# Patient Record
Sex: Female | Born: 1955 | Race: Black or African American | Hispanic: No | Marital: Married | Smoking: Former smoker
Health system: Southern US, Community
[De-identification: ages and names within clinical notes are randomized; demographics above are authoritative.]

## PROBLEM LIST (undated history)

## (undated) DIAGNOSIS — E119 Type 2 diabetes mellitus without complications: Secondary | ICD-10-CM

## (undated) DIAGNOSIS — Z973 Presence of spectacles and contact lenses: Secondary | ICD-10-CM

## (undated) DIAGNOSIS — T8859XA Other complications of anesthesia, initial encounter: Secondary | ICD-10-CM

## (undated) DIAGNOSIS — F329 Major depressive disorder, single episode, unspecified: Secondary | ICD-10-CM

## (undated) DIAGNOSIS — R011 Cardiac murmur, unspecified: Secondary | ICD-10-CM

## (undated) DIAGNOSIS — M1990 Unspecified osteoarthritis, unspecified site: Secondary | ICD-10-CM

## (undated) DIAGNOSIS — E785 Hyperlipidemia, unspecified: Secondary | ICD-10-CM

## (undated) DIAGNOSIS — I10 Essential (primary) hypertension: Secondary | ICD-10-CM

## (undated) DIAGNOSIS — H04123 Dry eye syndrome of bilateral lacrimal glands: Secondary | ICD-10-CM

## (undated) DIAGNOSIS — M25471 Effusion, right ankle: Secondary | ICD-10-CM

## (undated) DIAGNOSIS — K219 Gastro-esophageal reflux disease without esophagitis: Secondary | ICD-10-CM

## (undated) DIAGNOSIS — R32 Unspecified urinary incontinence: Secondary | ICD-10-CM

## (undated) DIAGNOSIS — M25472 Effusion, left ankle: Secondary | ICD-10-CM

## (undated) DIAGNOSIS — F32A Depression, unspecified: Secondary | ICD-10-CM

## (undated) DIAGNOSIS — T4145XA Adverse effect of unspecified anesthetic, initial encounter: Secondary | ICD-10-CM

## (undated) DIAGNOSIS — F419 Anxiety disorder, unspecified: Secondary | ICD-10-CM

## (undated) DIAGNOSIS — Z972 Presence of dental prosthetic device (complete) (partial): Secondary | ICD-10-CM

## (undated) HISTORY — PX: JOINT REPLACEMENT: SHX530

## (undated) HISTORY — PX: TUBAL LIGATION: SHX77

---

## 1998-01-10 ENCOUNTER — Other Ambulatory Visit: Admission: RE | Admit: 1998-01-10 | Discharge: 1998-01-10 | Payer: Self-pay | Admitting: Obstetrics and Gynecology

## 1999-01-23 ENCOUNTER — Other Ambulatory Visit: Admission: RE | Admit: 1999-01-23 | Discharge: 1999-01-23 | Payer: Self-pay | Admitting: Obstetrics and Gynecology

## 1999-06-09 ENCOUNTER — Encounter: Admission: RE | Admit: 1999-06-09 | Discharge: 1999-06-09 | Payer: Self-pay | Admitting: Obstetrics and Gynecology

## 1999-06-09 ENCOUNTER — Encounter: Payer: Self-pay | Admitting: Obstetrics and Gynecology

## 2000-01-17 ENCOUNTER — Other Ambulatory Visit: Admission: RE | Admit: 2000-01-17 | Discharge: 2000-01-17 | Payer: Self-pay | Admitting: Obstetrics and Gynecology

## 2001-01-01 HISTORY — PX: ABDOMINAL HYSTERECTOMY: SHX81

## 2001-02-04 ENCOUNTER — Other Ambulatory Visit: Admission: RE | Admit: 2001-02-04 | Discharge: 2001-02-04 | Payer: Self-pay | Admitting: Obstetrics and Gynecology

## 2001-06-04 ENCOUNTER — Inpatient Hospital Stay (HOSPITAL_COMMUNITY): Admission: RE | Admit: 2001-06-04 | Discharge: 2001-06-06 | Payer: Self-pay | Admitting: Obstetrics and Gynecology

## 2002-02-18 ENCOUNTER — Other Ambulatory Visit: Admission: RE | Admit: 2002-02-18 | Discharge: 2002-02-18 | Payer: Self-pay | Admitting: Obstetrics and Gynecology

## 2003-02-19 ENCOUNTER — Other Ambulatory Visit: Admission: RE | Admit: 2003-02-19 | Discharge: 2003-02-19 | Payer: Self-pay | Admitting: Obstetrics and Gynecology

## 2004-03-08 ENCOUNTER — Other Ambulatory Visit: Admission: RE | Admit: 2004-03-08 | Discharge: 2004-03-08 | Payer: Self-pay | Admitting: Obstetrics and Gynecology

## 2004-07-12 ENCOUNTER — Ambulatory Visit: Payer: Self-pay | Admitting: Internal Medicine

## 2005-01-29 ENCOUNTER — Ambulatory Visit: Payer: Self-pay | Admitting: Internal Medicine

## 2005-04-02 ENCOUNTER — Other Ambulatory Visit: Admission: RE | Admit: 2005-04-02 | Discharge: 2005-04-02 | Payer: Self-pay | Admitting: Obstetrics and Gynecology

## 2010-09-15 ENCOUNTER — Other Ambulatory Visit: Payer: Self-pay | Admitting: Family Medicine

## 2010-09-15 DIAGNOSIS — R51 Headache: Secondary | ICD-10-CM

## 2010-09-17 ENCOUNTER — Ambulatory Visit
Admission: RE | Admit: 2010-09-17 | Discharge: 2010-09-17 | Disposition: A | Discharge status: Home / Self Care | Payer: BC Managed Care – PPO | Source: Ambulatory Visit | Attending: Family Medicine | Admitting: Family Medicine

## 2010-09-17 DIAGNOSIS — R51 Headache: Secondary | ICD-10-CM

## 2012-12-12 ENCOUNTER — Other Ambulatory Visit: Payer: Self-pay | Admitting: Sports Medicine

## 2012-12-12 DIAGNOSIS — M5430 Sciatica, unspecified side: Secondary | ICD-10-CM

## 2012-12-12 DIAGNOSIS — M545 Low back pain: Secondary | ICD-10-CM

## 2013-01-15 ENCOUNTER — Ambulatory Visit
Admission: RE | Admit: 2013-01-15 | Discharge: 2013-01-15 | Disposition: A | Discharge status: Home / Self Care | Payer: BC Managed Care – PPO | Source: Ambulatory Visit | Attending: Sports Medicine | Admitting: Sports Medicine

## 2013-01-15 DIAGNOSIS — M5430 Sciatica, unspecified side: Secondary | ICD-10-CM

## 2013-01-15 DIAGNOSIS — M545 Low back pain, unspecified: Secondary | ICD-10-CM

## 2014-02-04 ENCOUNTER — Other Ambulatory Visit: Payer: Self-pay | Admitting: Obstetrics and Gynecology

## 2014-02-05 LAB — CYTOLOGY - PAP

## 2015-01-11 ENCOUNTER — Ambulatory Visit: Payer: Self-pay | Admitting: Orthopedic Surgery

## 2015-01-11 NOTE — Progress Notes (Signed)
Preoperative surgical orders have been place into the Epic hospital system for Debra Woods on 01/11/2015, 9:22 PM  by Patrica DuelPERKINS, ALEXZANDREW for surgery on 01/28/2015.  Preop Total Hip - Anterior Approach orders including IV Tylenol, and IV Decadron as long as there are no contraindications to the above medications. Avel Peacerew Perkins, PA-C

## 2015-01-17 ENCOUNTER — Encounter (HOSPITAL_COMMUNITY)
Admission: RE | Admit: 2015-01-17 | Discharge: 2015-01-17 | Disposition: A | Discharge status: Home / Self Care | Payer: BC Managed Care – PPO | Source: Ambulatory Visit | Attending: Orthopedic Surgery | Admitting: Orthopedic Surgery

## 2015-01-17 ENCOUNTER — Encounter (HOSPITAL_COMMUNITY): Payer: Self-pay

## 2015-01-17 DIAGNOSIS — R9431 Abnormal electrocardiogram [ECG] [EKG]: Secondary | ICD-10-CM | POA: Insufficient documentation

## 2015-01-17 DIAGNOSIS — Z01812 Encounter for preprocedural laboratory examination: Secondary | ICD-10-CM | POA: Insufficient documentation

## 2015-01-17 DIAGNOSIS — Z79899 Other long term (current) drug therapy: Secondary | ICD-10-CM | POA: Insufficient documentation

## 2015-01-17 DIAGNOSIS — Z0183 Encounter for blood typing: Secondary | ICD-10-CM | POA: Insufficient documentation

## 2015-01-17 DIAGNOSIS — E119 Type 2 diabetes mellitus without complications: Secondary | ICD-10-CM | POA: Diagnosis not present

## 2015-01-17 DIAGNOSIS — Z01818 Encounter for other preprocedural examination: Secondary | ICD-10-CM | POA: Diagnosis present

## 2015-01-17 DIAGNOSIS — M1612 Unilateral primary osteoarthritis, left hip: Secondary | ICD-10-CM | POA: Insufficient documentation

## 2015-01-17 DIAGNOSIS — F329 Major depressive disorder, single episode, unspecified: Secondary | ICD-10-CM | POA: Insufficient documentation

## 2015-01-17 DIAGNOSIS — Z7984 Long term (current) use of oral hypoglycemic drugs: Secondary | ICD-10-CM | POA: Insufficient documentation

## 2015-01-17 DIAGNOSIS — I10 Essential (primary) hypertension: Secondary | ICD-10-CM | POA: Diagnosis not present

## 2015-01-17 DIAGNOSIS — F419 Anxiety disorder, unspecified: Secondary | ICD-10-CM | POA: Insufficient documentation

## 2015-01-17 DIAGNOSIS — K219 Gastro-esophageal reflux disease without esophagitis: Secondary | ICD-10-CM | POA: Diagnosis not present

## 2015-01-17 HISTORY — DX: Anxiety disorder, unspecified: F41.9

## 2015-01-17 HISTORY — DX: Other complications of anesthesia, initial encounter: T88.59XA

## 2015-01-17 HISTORY — DX: Gastro-esophageal reflux disease without esophagitis: K21.9

## 2015-01-17 HISTORY — DX: Major depressive disorder, single episode, unspecified: F32.9

## 2015-01-17 HISTORY — DX: Adverse effect of unspecified anesthetic, initial encounter: T41.45XA

## 2015-01-17 HISTORY — DX: Depression, unspecified: F32.A

## 2015-01-17 HISTORY — DX: Unspecified osteoarthritis, unspecified site: M19.90

## 2015-01-17 HISTORY — DX: Essential (primary) hypertension: I10

## 2015-01-17 LAB — URINE MICROSCOPIC-ADD ON: RBC / HPF: NONE SEEN RBC/hpf (ref 0–5)

## 2015-01-17 LAB — CBC
HEMATOCRIT: 37.7 % (ref 36.0–46.0)
Hemoglobin: 12.1 g/dL (ref 12.0–15.0)
MCH: 27.4 pg (ref 26.0–34.0)
MCHC: 32.1 g/dL (ref 30.0–36.0)
MCV: 85.3 fL (ref 78.0–100.0)
Platelets: 359 10*3/uL (ref 150–400)
RBC: 4.42 MIL/uL (ref 3.87–5.11)
RDW: 13.4 % (ref 11.5–15.5)
WBC: 9.5 10*3/uL (ref 4.0–10.5)

## 2015-01-17 LAB — URINALYSIS, ROUTINE W REFLEX MICROSCOPIC
BILIRUBIN URINE: NEGATIVE
GLUCOSE, UA: NEGATIVE mg/dL
HGB URINE DIPSTICK: NEGATIVE
Ketones, ur: 15 mg/dL — AB
Nitrite: NEGATIVE
PH: 5.5 (ref 5.0–8.0)
Protein, ur: NEGATIVE mg/dL
SPECIFIC GRAVITY, URINE: 1.023 (ref 1.005–1.030)

## 2015-01-17 LAB — COMPREHENSIVE METABOLIC PANEL
ALBUMIN: 4 g/dL (ref 3.5–5.0)
ALK PHOS: 97 U/L (ref 38–126)
ALT: 16 U/L (ref 14–54)
ANION GAP: 10 (ref 5–15)
AST: 18 U/L (ref 15–41)
BILIRUBIN TOTAL: 0.7 mg/dL (ref 0.3–1.2)
BUN: 18 mg/dL (ref 6–20)
CALCIUM: 9.7 mg/dL (ref 8.9–10.3)
CO2: 27 mmol/L (ref 22–32)
Chloride: 103 mmol/L (ref 101–111)
Creatinine, Ser: 1.27 mg/dL — ABNORMAL HIGH (ref 0.44–1.00)
GFR, EST AFRICAN AMERICAN: 53 mL/min — AB (ref >60)
GFR, EST NON AFRICAN AMERICAN: 46 mL/min — AB (ref >60)
GLUCOSE: 141 mg/dL — AB (ref 65–99)
POTASSIUM: 4.2 mmol/L (ref 3.5–5.1)
Sodium: 140 mmol/L (ref 135–145)
TOTAL PROTEIN: 6.6 g/dL (ref 6.5–8.1)

## 2015-01-17 LAB — TYPE AND SCREEN
ABO/RH(D): O POS
ANTIBODY SCREEN: NEGATIVE

## 2015-01-17 LAB — PROTIME-INR
INR: 1.01 (ref 0.00–1.49)
PROTHROMBIN TIME: 13.5 s (ref 11.6–15.2)

## 2015-01-17 LAB — APTT: APTT: 25 s (ref 24–37)

## 2015-01-17 LAB — SURGICAL PCR SCREEN
MRSA, PCR: NEGATIVE
Staphylococcus aureus: NEGATIVE

## 2015-01-17 LAB — GLUCOSE, CAPILLARY: Glucose-Capillary: 153 mg/dL — ABNORMAL HIGH (ref 65–99)

## 2015-01-17 LAB — ABO/RH: ABO/RH(D): O POS

## 2015-01-17 NOTE — Progress Notes (Signed)
Anesthesia PAT Evaluation: Patient is a 60 year old female scheduled for left THA, anterior approach on 01/28/15 by Dr. Lequita HaltAluisio.  History includes smoking, DM2, GERD, HTN, anxiety, depression, arthritis, hysterectomy. BMI 29.64. For anesthesia history, reported CPR/defibrillation after her hysterectomy ~ 2003 (at [facility]) due to her "quit breathing." She denied cardiac issues and having to see a cardiologist or get additional testing. She denied OSA history and did not have to go home on O2 but took about 1 month to recover at home (was "sluggish"). She denied family history of pseudocholinesterase deficiency or known family history of anesthesia complication. She did okay with colonoscopy and recent EGD with Dr. Madilyn FiremanHayes. She has not required GA since her hysterectomy.   06/04/01 Hospital records received this afternoon. According to Code note: "...patient was found by nurse unresponsive to sternal rub. Patient had been on Morphine PCA, and had received Toradol ~ 45 minutes earlier. Patient apneic on arrival, but after Narcan - spontaneous respirations with 100% O2 sat, RR 20, peripheral pulses intact, bonding. Rhythm strip: Sinus rhythm, rate 91, BP 114/60. Patient then appropriate in responses, although slightly groggy; [facility]."    PCP is Dr. Merri Brunetteandace Smith who has medically cleared patient for surgery.   Meds include Lipitor, Wellbutrin XL, Votaren, lisinopril-HCTZ, metformin, Protonix, Ambien. Recently had also started Januvia samples but is now out and will have to follow-up with her PCP.  01/17/15 EKG: NSR, cannot rule out anterior infarct (age undetermined). No comparison tracing in Epic or Muse. She denied CP, SOB. Has chronic BLE edema. Activity is limited by hip pain, but she still does weight training primarily with upper body. On exam, heart RRR. No murmur noted. Lungs clear. No carotid bruits noted. Short neck. Somewhat small mouth  (documented as easy intubation with MAC 3 on 06/04/01 anesthesia record).  Preoperative labs noted. A1c pending. Reports fasting home CBG ~ 140-145.   She is still determining if she would like spinal versus GA. Denied history of CAD or murmur, bleeding history, anticoagulants, and back surgeries. We discussed some risks/benefits associated with each. She feels comfortable talking with her assigned anesthesiologist on the day of surgery, but wanted to make sure they were aware of her "code" following hysterectomy in 2003. I have updated anesthesiologist Dr. Krista BlueSinger.  Velna Ochsllison Estell Puccini, PA-C [facility]Anesthesiology Phone [phone] 01/17/2015 5:40 PM

## 2015-01-17 NOTE — Progress Notes (Signed)
Pt requested to see Debra Woods  To discuss complications of anesthesia.

## 2015-01-17 NOTE — Pre-Procedure Instructions (Signed)
    Eloy EndBrenda B Ziemann  01/17/2015      [id number] Renato Battles- [place], [place] - [address] [place] [id number] Phone: [phone] Fax: [phone]    Your procedure is scheduled on 01/28/15.  [facility] Admitting at 12 noon  Call this number if you have problems the morning of surgery:  [phone]   Remember:  Do not eat food or drink liquids after midnight.  Take these medicines the morning of surgery with A SIP OF WATER --protonix   Do not wear jewelry, make-up or nail polish.  Do not wear lotions, powders, or perfumes.  You may wear deodorant.  Do not shave 48 hours prior to surgery.  Men may shave face and neck.  Do not bring valuables to the hospital.  [facility] is not responsible for any belongings or valuables.  Contacts, dentures or bridgework may not be worn into surgery.  Leave your suitcase in the car.  After surgery it may be brought to your room.  For patients admitted to the hospital, discharge time will be determined by your treatment team.  Patients discharged the day of surgery will not be allowed to drive home.   Name and phone number of your driver:   Special instructions:   Please read over the following fact sheets that you were given. Pain Booklet, Coughing and Deep Breathing, Blood Transfusion Information, MRSA Information and Surgical Site Infection Prevention

## 2015-01-18 LAB — HEMOGLOBIN A1C
Hgb A1c MFr Bld: 7.8 % — ABNORMAL HIGH (ref 4.8–5.6)
Mean Plasma Glucose: 177 mg/dL

## 2015-01-27 ENCOUNTER — Ambulatory Visit: Payer: Self-pay | Admitting: Orthopedic Surgery

## 2015-01-27 MED ORDER — SODIUM CHLORIDE 0.9 % IV SOLN: INTRAVENOUS | Status: DC

## 2015-01-27 MED ORDER — CHLORHEXIDINE GLUCONATE 4 % EX LIQD: 60.0000 mL | Freq: Once | CUTANEOUS | Status: DC

## 2015-01-27 MED ORDER — CEFAZOLIN SODIUM-DEXTROSE 2-3 GM-% IV SOLR
2.0000 g | INTRAVENOUS | Status: AC
  Administered 2015-01-28: 2 g via INTRAVENOUS
  Filled 2015-01-27: qty 50

## 2015-01-27 MED ORDER — ACETAMINOPHEN 10 MG/ML IV SOLN
1000.0000 mg | Freq: Once | INTRAVENOUS | Status: AC
  Administered 2015-01-28: 1000 mg via INTRAVENOUS
  Filled 2015-01-27: qty 100

## 2015-01-27 MED ORDER — TRANEXAMIC ACID 1000 MG/10ML IV SOLN
1000.0000 mg | INTRAVENOUS | Status: AC
  Administered 2015-01-28: 1000 mg via INTRAVENOUS
  Filled 2015-01-27: qty 10

## 2015-01-27 MED ORDER — DEXAMETHASONE SODIUM PHOSPHATE 10 MG/ML IJ SOLN
10.0000 mg | Freq: Once | INTRAMUSCULAR | Status: AC
  Administered 2015-01-28: 10 mg via INTRAVENOUS
  Filled 2015-01-27: qty 1

## 2015-01-27 NOTE — H&P (Signed)
Debra Woods DOB: 09/24/1955 Married / Language: English / Race: Black or African American Female Date of Admission:  01/28/2015 CC:  Left Hip Pain History of Present Illness The patient is a 59 year old female who comes in today for a preoperative History and Physical. The patient is scheduled for a left total hip arthroplasty (anterior) to be performed by Dr. Frank V. [name], MD at [facility] on 01-28-2015. The patient reported left hip problems including pain (pt. can't walk well), giving way, catching (locking) and popping symptoms that have been present for 1 year(s). The symptoms began without any known injury. The patient reports symptoms radiating to the: right thigh (lateral thigh). The patient describes the hip problem as sharp, dull, aching, throbbing, grinding, stabbing and burning.The symptoms are described as moderate in severity.The patient feels as if their symptoms are does feel they are worsening. Current treatment includes use of a walker ((cane)) and nonsteroidal anti-inflammatory drugs (hydrocodone for pain.). Previous workup for this problem has included hip x-rays. Previous treatment for this problem has included nonsteroidal anti-inflammatory drugs (also, cortisone injection in her left hip.-injection helped some). Note for "Hip problem": diagnosed with OA in left hip. She has had severe problems with that hip for over a year now. She has some discomfort prior to that, but it has gotten much worse in the past year. Left hip hurts more than the right. It is limiting what she can and cannot do. She tries to do most of her activities and push through the pain and is able to do so on most occasions, but on other times, it is very difficult to push through. She has had two intraarticular injections, the first of which helped for several months, second of which helped for short amount of time. Pain is in the left groin radiating towards the knee. Right side also hurts, but to a  lesser degree. She has had a significant loss of motion and function which has gotten progressively worse in the past year. She has also loss of movement on the right side. She is at a stage where her hips essentially taking over her life and she is ready to proceed with surgery at this time. They have been treated conservatively in the past for the above stated problem and despite conservative measures, they continue to have progressive pain and severe functional limitations and dysfunction. They have failed non-operative management including home exercise, medications, and injections. It is felt that they would benefit from undergoing total joint replacement. Risks and benefits of the procedure have been discussed with the patient and they elect to proceed with surgery. There are no active contraindications to surgery such as ongoing infection or rapidly progressive neurological disease.  Problem List/Past Medical  Primary osteoarthritis of left hip (M16.12)  Impaired Vision  Depression  High blood pressure  Hypercholesterolemia  Gastroesophageal Reflux Disease  Diabetes Mellitus, Type II  Menopause  Allergies No Known Drug Allergies  Family History Cerebrovascular Accident  father Diabetes Mellitus  mother, father and sister Heart Disease  father Heart disease in female family member before age 55  Hypertension  mother, father and sister Rheumatoid Arthritis  mother  Social History Alcohol use  current drinker; drinks wine; 5-7 per week Children  2 Current work status  working full time Drug/Alcohol Rehab (Currently)  no Drug/Alcohol Rehab (Previously)  no Illicit drug use  no Living situation  live with spouse Marital status  married Number of flights of stairs before winded    2-3 Pain Contract  no Tobacco / smoke exposure  yes outdoors only Tobacco use  current some days smoker; smoke(d) less than 1/2 pack(s) per day Post-Surgical Plans   Home  Medication History MetFORMIN HCl (850MG Tablet, Oral two times daily) Active. Diclofenac Sodium (75MG Tablet DR, Oral daily) Active. BuPROPion HCl ER (XL) (300MG Tablet ER 24HR, Oral daily) Active. (four days a week) Pantoprazole Sodium (40MG Tablet DR, Oral daily) Active. Atorvastatin Calcium (20MG Tablet, Oral daily) Active. Lisinopril-Hydrochlorothiazide (10-12.5MG Tablet, Oral daily) Active. Hydrocodone-Acetaminophen (5-325MG Tablet, Oral) Active. Zolpidem Tartrate (10MG Tablet, 1/2 tab Oral) Active. FreeStyle Lite Test (In Vitro) Active.  Past Surgical History Hysterectomy  partial (non-cancerous) Tubal Ligation   Review of Systems General Not Present- Chills, Fatigue, Fever, Memory Loss, Night Sweats, Weight Gain and Weight Loss. Skin Not Present- Eczema, Hives, Itching, Lesions and Rash. HEENT Not Present- Dentures, Double Vision, Headache, Hearing Loss, Tinnitus and Visual Loss. Respiratory Not Present- Allergies, Chronic Cough, Coughing up blood, Shortness of breath at rest and Shortness of breath with exertion. Cardiovascular Not Present- Chest Pain, Difficulty Breathing Lying Down, Murmur, Palpitations, Racing/skipping heartbeats and Swelling. Gastrointestinal Not Present- Abdominal Pain, Bloody Stool, Constipation, Diarrhea, Difficulty Swallowing, Heartburn, Jaundice, Loss of appetitie, Nausea and Vomiting. Female Genitourinary Present- Urinating at Night. Not Present- Blood in Urine, Discharge, Flank Pain, Incontinence, Painful Urination, Urgency, Urinary frequency, Urinary Retention and Weak urinary stream. Musculoskeletal Not Present- Back Pain, Joint Pain, Joint Swelling, Morning Stiffness, Muscle Pain, Muscle Weakness and Spasms. Neurological Not Present- Blackout spells, Difficulty with balance, Dizziness, Paralysis, Tremor and Weakness. Psychiatric Not Present- Insomnia.  Vitals Weight: 165 lb Height: 62in Weight was reported by patient. Height  was reported by patient. Body Surface Area: 1.76 m Body Mass Index: 30.18 kg/m  BP: 122/76 (Sitting, Left Arm, Standard)  Physical Exam General Mental Status -Alert, cooperative and good historian. General Appearance-pleasant, Not in acute distress. Orientation-Oriented X3. Build & Nutrition-Well nourished and Well developed.  Head and Neck Head-normocephalic, atraumatic . Neck Global Assessment - supple, no bruit auscultated on the right, no bruit auscultated on the left.  Eye Pupil - Bilateral-Regular and Round. Motion - Bilateral-EOMI.  ENMT Note: upper partial denture plate   Chest and Lung Exam Auscultation Breath sounds - clear at anterior chest wall and clear at posterior chest wall. Adventitious sounds - No Adventitious sounds.  Cardiovascular Auscultation Rhythm - Regular rate and rhythm. Heart Sounds - S1 WNL and S2 WNL. Murmurs & Other Heart Sounds - Auscultation of the heart reveals - No Murmurs.  Abdomen Palpation/Percussion Tenderness - Abdomen is non-tender to palpation. Rigidity (guarding) - Abdomen is soft. Auscultation Auscultation of the abdomen reveals - Bowel sounds normal.  Female Genitourinary Note: Not done, not pertinent to present illness   Musculoskeletal Note: Very pleasant well-developed female, alert and oriented, in no apparent distress. Evaluation of the left hip shows flexion to about 70, no internal rotation and in fact she has got about 20 degree external rotation contracture. She can externally rotate beyond that. She can only abduct about 20. Right hip flexion about 90, minimal internal rotation, about 30 external rotation and 30 abduction. Knee exams are normal. Pulse, sensation and motor intact both lower extremities.  RADIOGRAPHS AP pelvis and lateral of the hips show severe end-stage arthritis, left worse than right hip. She has got complete bone on bone on the left with large subchondral cyst. This is a  marked progression from a year and half ago. Her right hip is just about bone   on bone also with very Orne joint space left.  Assessment & Plan Primary osteoarthritis of left hip (M16.12)  Note:Surgical Plans: Left Total Hip Replacement - Anterior Approach  Disposition: Home  PCP: Dr. C. Smith - Patient has been seen preoperatively and felt to be stable for surgery.  IV TXA  Anesthesia Issues: Cardiac Arrest following the Hysterectomy (approx. 14 years ago).  Signed electronically by Alexzandrew L Perkins, III PA-C 

## 2015-01-28 ENCOUNTER — Inpatient Hospital Stay (HOSPITAL_COMMUNITY): Payer: BC Managed Care – PPO | Admitting: Certified Registered Nurse Anesthetist

## 2015-01-28 ENCOUNTER — Inpatient Hospital Stay (HOSPITAL_COMMUNITY)
Admission: RE | Admit: 2015-01-28 | Discharge: 2015-01-30 | DRG: 470 | Disposition: A | Discharge status: Home Health | Payer: BC Managed Care – PPO | Source: Ambulatory Visit | Attending: Orthopedic Surgery | Admitting: Orthopedic Surgery

## 2015-01-28 ENCOUNTER — Inpatient Hospital Stay (HOSPITAL_COMMUNITY): Payer: BC Managed Care – PPO

## 2015-01-28 ENCOUNTER — Inpatient Hospital Stay (HOSPITAL_COMMUNITY): Payer: BC Managed Care – PPO | Admitting: Vascular Surgery

## 2015-01-28 ENCOUNTER — Encounter (HOSPITAL_COMMUNITY): Payer: Self-pay | Admitting: Surgery

## 2015-01-28 ENCOUNTER — Encounter (HOSPITAL_COMMUNITY)
Admission: RE | Disposition: A | Discharge status: Home Health | Payer: Self-pay | Source: Ambulatory Visit | Attending: Orthopedic Surgery

## 2015-01-28 DIAGNOSIS — I10 Essential (primary) hypertension: Secondary | ICD-10-CM | POA: Diagnosis present

## 2015-01-28 DIAGNOSIS — M1612 Unilateral primary osteoarthritis, left hip: Principal | ICD-10-CM | POA: Diagnosis present

## 2015-01-28 DIAGNOSIS — Z7984 Long term (current) use of oral hypoglycemic drugs: Secondary | ICD-10-CM

## 2015-01-28 DIAGNOSIS — Z96649 Presence of unspecified artificial hip joint: Secondary | ICD-10-CM

## 2015-01-28 DIAGNOSIS — F419 Anxiety disorder, unspecified: Secondary | ICD-10-CM | POA: Diagnosis present

## 2015-01-28 DIAGNOSIS — Z419 Encounter for procedure for purposes other than remedying health state, unspecified: Secondary | ICD-10-CM

## 2015-01-28 DIAGNOSIS — K219 Gastro-esophageal reflux disease without esophagitis: Secondary | ICD-10-CM | POA: Diagnosis present

## 2015-01-28 DIAGNOSIS — F329 Major depressive disorder, single episode, unspecified: Secondary | ICD-10-CM | POA: Diagnosis present

## 2015-01-28 DIAGNOSIS — Z79899 Other long term (current) drug therapy: Secondary | ICD-10-CM | POA: Diagnosis not present

## 2015-01-28 DIAGNOSIS — H547 Unspecified visual loss: Secondary | ICD-10-CM | POA: Diagnosis present

## 2015-01-28 DIAGNOSIS — E7800 Pure hypercholesterolemia, unspecified: Secondary | ICD-10-CM | POA: Diagnosis present

## 2015-01-28 DIAGNOSIS — M169 Osteoarthritis of hip, unspecified: Secondary | ICD-10-CM | POA: Diagnosis present

## 2015-01-28 DIAGNOSIS — E119 Type 2 diabetes mellitus without complications: Secondary | ICD-10-CM | POA: Diagnosis present

## 2015-01-28 DIAGNOSIS — M25552 Pain in left hip: Secondary | ICD-10-CM | POA: Diagnosis present

## 2015-01-28 DIAGNOSIS — F17200 Nicotine dependence, unspecified, uncomplicated: Secondary | ICD-10-CM | POA: Diagnosis present

## 2015-01-28 HISTORY — DX: Hyperlipidemia, unspecified: E78.5

## 2015-01-28 HISTORY — DX: Type 2 diabetes mellitus without complications: E11.9

## 2015-01-28 HISTORY — PX: TOTAL HIP ARTHROPLASTY: SHX124

## 2015-01-28 LAB — GLUCOSE, CAPILLARY
Glucose-Capillary: 144 mg/dL — ABNORMAL HIGH (ref 65–99)
Glucose-Capillary: 126 mg/dL — ABNORMAL HIGH (ref 65–99)
Glucose-Capillary: 184 mg/dL — ABNORMAL HIGH (ref 65–99)

## 2015-01-28 SURGERY — ARTHROPLASTY, HIP, TOTAL, ANTERIOR APPROACH
Anesthesia: Monitor Anesthesia Care | Site: Hip | Laterality: Left

## 2015-01-28 MED ORDER — MIDAZOLAM HCL 5 MG/5ML IJ SOLN
INTRAMUSCULAR | Status: DC | PRN
  Administered 2015-01-28: 2 mg via INTRAVENOUS

## 2015-01-28 MED ORDER — RIVAROXABAN 10 MG PO TABS
10.0000 mg | ORAL_TABLET | Freq: Every day | ORAL | Status: DC
Start: 2015-01-29 — End: 2015-01-30
  Administered 2015-01-29 – 2015-01-30 (×2): 10 mg via ORAL
  Filled 2015-01-28 (×2): qty 1

## 2015-01-28 MED ORDER — ZOLPIDEM TARTRATE 5 MG PO TABS: 5.0000 mg | ORAL_TABLET | Freq: Every evening | ORAL | Status: DC | PRN

## 2015-01-28 MED ORDER — MORPHINE SULFATE (PF) 2 MG/ML IV SOLN
1.0000 mg | INTRAVENOUS | Status: DC | PRN
  Administered 2015-01-28: 1 mg via INTRAVENOUS
  Filled 2015-01-28: qty 1

## 2015-01-28 MED ORDER — METFORMIN HCL 850 MG PO TABS
850.0000 mg | ORAL_TABLET | Freq: Two times a day (BID) | ORAL | Status: DC
  Administered 2015-01-29 – 2015-01-30 (×3): 850 mg via ORAL
  Filled 2015-01-28 (×6): qty 1

## 2015-01-28 MED ORDER — PHENOL 1.4 % MT LIQD
1.0000 | OROMUCOSAL | Status: DC | PRN
Start: 2015-01-28 — End: 2015-01-30

## 2015-01-28 MED ORDER — OXYCODONE HCL 5 MG PO TABS: 5.0000 mg | ORAL_TABLET | Freq: Once | ORAL | Status: DC | PRN

## 2015-01-28 MED ORDER — OXYCODONE HCL 5 MG PO TABS
5.0000 mg | ORAL_TABLET | ORAL | Status: DC | PRN
  Administered 2015-01-28 – 2015-01-30 (×5): 10 mg via ORAL
  Filled 2015-01-28 (×7): qty 2

## 2015-01-28 MED ORDER — DOCUSATE SODIUM 100 MG PO CAPS
100.0000 mg | ORAL_CAPSULE | Freq: Two times a day (BID) | ORAL | Status: DC
  Administered 2015-01-28 – 2015-01-30 (×4): 100 mg via ORAL
  Filled 2015-01-28 (×4): qty 1

## 2015-01-28 MED ORDER — 0.9 % SODIUM CHLORIDE (POUR BTL) OPTIME
TOPICAL | Status: DC | PRN
Start: 2015-01-28 — End: 2015-01-28
  Administered 2015-01-28: 1000 mL

## 2015-01-28 MED ORDER — BISACODYL 10 MG RE SUPP: 10.0000 mg | Freq: Every day | RECTAL | Status: DC | PRN

## 2015-01-28 MED ORDER — KETOROLAC TROMETHAMINE 15 MG/ML IJ SOLN
INTRAMUSCULAR | Status: AC
  Filled 2015-01-28: qty 1

## 2015-01-28 MED ORDER — PHENYLEPHRINE HCL 10 MG/ML IJ SOLN
INTRAMUSCULAR | Status: DC | PRN
  Administered 2015-01-28 (×4): 120 ug via INTRAVENOUS

## 2015-01-28 MED ORDER — MENTHOL 3 MG MT LOZG: 1.0000 | LOZENGE | OROMUCOSAL | Status: DC | PRN

## 2015-01-28 MED ORDER — LACTATED RINGERS IV SOLN
INTRAVENOUS | Status: DC
  Administered 2015-01-28 (×2): via INTRAVENOUS

## 2015-01-28 MED ORDER — METOCLOPRAMIDE HCL 5 MG PO TABS: 5.0000 mg | ORAL_TABLET | Freq: Three times a day (TID) | ORAL | Status: DC | PRN

## 2015-01-28 MED ORDER — ACETAMINOPHEN 325 MG PO TABS
650.0000 mg | ORAL_TABLET | Freq: Four times a day (QID) | ORAL | Status: DC | PRN
  Filled 2015-01-28: qty 2

## 2015-01-28 MED ORDER — KETOROLAC TROMETHAMINE 15 MG/ML IJ SOLN
7.5000 mg | Freq: Four times a day (QID) | INTRAMUSCULAR | Status: AC | PRN
  Administered 2015-01-28: 7.5 mg via INTRAVENOUS
  Filled 2015-01-28: qty 1

## 2015-01-28 MED ORDER — METHOCARBAMOL 500 MG PO TABS
500.0000 mg | ORAL_TABLET | Freq: Four times a day (QID) | ORAL | Status: DC | PRN
  Filled 2015-01-28: qty 1

## 2015-01-28 MED ORDER — PHENYLEPHRINE 40 MCG/ML (10ML) SYRINGE FOR IV PUSH (FOR BLOOD PRESSURE SUPPORT)
PREFILLED_SYRINGE | INTRAVENOUS | Status: AC
  Filled 2015-01-28: qty 40

## 2015-01-28 MED ORDER — FENTANYL CITRATE (PF) 100 MCG/2ML IJ SOLN
INTRAMUSCULAR | Status: DC | PRN
  Administered 2015-01-28: 25 ug via INTRAVENOUS
  Administered 2015-01-28: 50 ug via INTRAVENOUS

## 2015-01-28 MED ORDER — BUPROPION HCL ER (XL) 150 MG PO TB24
300.0000 mg | ORAL_TABLET | Freq: Every morning | ORAL | Status: DC
  Administered 2015-01-29 – 2015-01-30 (×2): 300 mg via ORAL
  Filled 2015-01-28 (×2): qty 2

## 2015-01-28 MED ORDER — ACETAMINOPHEN 650 MG RE SUPP: 650.0000 mg | Freq: Four times a day (QID) | RECTAL | Status: DC | PRN

## 2015-01-28 MED ORDER — PHENYLEPHRINE HCL 10 MG/ML IJ SOLN
10.0000 mg | INTRAVENOUS | Status: DC | PRN
  Administered 2015-01-28: 15 ug/min via INTRAVENOUS

## 2015-01-28 MED ORDER — METOCLOPRAMIDE HCL 5 MG/ML IJ SOLN: 5.0000 mg | Freq: Three times a day (TID) | INTRAMUSCULAR | Status: DC | PRN

## 2015-01-28 MED ORDER — METHOCARBAMOL 1000 MG/10ML IJ SOLN
500.0000 mg | Freq: Four times a day (QID) | INTRAMUSCULAR | Status: DC | PRN
  Filled 2015-01-28: qty 5

## 2015-01-28 MED ORDER — ALBUMIN HUMAN 5 % IV SOLN
INTRAVENOUS | Status: DC | PRN
  Administered 2015-01-28: 13:00:00 via INTRAVENOUS

## 2015-01-28 MED ORDER — FENTANYL CITRATE (PF) 100 MCG/2ML IJ SOLN
25.0000 ug | INTRAMUSCULAR | Status: DC | PRN
  Administered 2015-01-28 (×2): 50 ug via INTRAVENOUS

## 2015-01-28 MED ORDER — PROPOFOL 500 MG/50ML IV EMUL
INTRAVENOUS | Status: DC | PRN
  Administered 2015-01-28: 75 ug/kg/min via INTRAVENOUS

## 2015-01-28 MED ORDER — PANTOPRAZOLE SODIUM 40 MG PO TBEC
40.0000 mg | DELAYED_RELEASE_TABLET | Freq: Every day | ORAL | Status: DC
  Administered 2015-01-29 – 2015-01-30 (×2): 40 mg via ORAL
  Filled 2015-01-28 (×2): qty 1

## 2015-01-28 MED ORDER — SODIUM CHLORIDE 0.9 % IV SOLN
INTRAVENOUS | Status: DC
  Administered 2015-01-28: 21:00:00 via INTRAVENOUS

## 2015-01-28 MED ORDER — ONDANSETRON HCL 4 MG/2ML IJ SOLN: 4.0000 mg | Freq: Four times a day (QID) | INTRAMUSCULAR | Status: DC | PRN

## 2015-01-28 MED ORDER — DEXAMETHASONE SODIUM PHOSPHATE 10 MG/ML IJ SOLN
10.0000 mg | Freq: Once | INTRAMUSCULAR | Status: AC
  Administered 2015-01-29: 10 mg via INTRAVENOUS
  Filled 2015-01-28: qty 1

## 2015-01-28 MED ORDER — ATORVASTATIN CALCIUM 20 MG PO TABS
20.0000 mg | ORAL_TABLET | Freq: Every day | ORAL | Status: DC
  Administered 2015-01-29 – 2015-01-30 (×2): 20 mg via ORAL
  Filled 2015-01-28 (×2): qty 1

## 2015-01-28 MED ORDER — BUPIVACAINE HCL (PF) 0.25 % IJ SOLN
INTRAMUSCULAR | Status: AC
Start: 2015-01-28 — End: 2015-01-28
  Filled 2015-01-28: qty 30

## 2015-01-28 MED ORDER — MIDAZOLAM HCL 2 MG/2ML IJ SOLN
INTRAMUSCULAR | Status: AC
  Filled 2015-01-28: qty 2

## 2015-01-28 MED ORDER — CEFAZOLIN SODIUM-DEXTROSE 2-3 GM-% IV SOLR
2.0000 g | Freq: Four times a day (QID) | INTRAVENOUS | Status: AC
  Administered 2015-01-28 – 2015-01-29 (×2): 2 g via INTRAVENOUS
  Filled 2015-01-28 (×2): qty 50

## 2015-01-28 MED ORDER — EPHEDRINE SULFATE 50 MG/ML IJ SOLN
INTRAMUSCULAR | Status: DC | PRN
  Administered 2015-01-28: 5 mg via INTRAVENOUS

## 2015-01-28 MED ORDER — OXYCODONE HCL 5 MG/5ML PO SOLN: 5.0000 mg | Freq: Once | ORAL | Status: DC | PRN

## 2015-01-28 MED ORDER — FENTANYL CITRATE (PF) 100 MCG/2ML IJ SOLN
INTRAMUSCULAR | Status: AC
  Filled 2015-01-28: qty 2

## 2015-01-28 MED ORDER — BUPIVACAINE HCL (PF) 0.25 % IJ SOLN
INTRAMUSCULAR | Status: DC | PRN
  Administered 2015-01-28: 30 mL

## 2015-01-28 MED ORDER — ACETAMINOPHEN 500 MG PO TABS
1000.0000 mg | ORAL_TABLET | Freq: Four times a day (QID) | ORAL | Status: AC
  Administered 2015-01-28 – 2015-01-29 (×4): 1000 mg via ORAL
  Filled 2015-01-28 (×4): qty 2

## 2015-01-28 MED ORDER — BUPIVACAINE IN DEXTROSE 0.75-8.25 % IT SOLN
INTRATHECAL | Status: DC | PRN
  Administered 2015-01-28: 1.6 mL via INTRATHECAL

## 2015-01-28 MED ORDER — FENTANYL CITRATE (PF) 250 MCG/5ML IJ SOLN
INTRAMUSCULAR | Status: AC
  Filled 2015-01-28: qty 5

## 2015-01-28 MED ORDER — POLYETHYLENE GLYCOL 3350 17 G PO PACK: 17.0000 g | PACK | Freq: Every day | ORAL | Status: DC | PRN

## 2015-01-28 MED ORDER — ONDANSETRON HCL 4 MG PO TABS: 4.0000 mg | ORAL_TABLET | Freq: Four times a day (QID) | ORAL | Status: DC | PRN

## 2015-01-28 MED ORDER — INSULIN ASPART 100 UNIT/ML ~~LOC~~ SOLN
0.0000 [IU] | Freq: Three times a day (TID) | SUBCUTANEOUS | Status: DC
  Administered 2015-01-29: 3 [IU] via SUBCUTANEOUS
  Administered 2015-01-29: 5 [IU] via SUBCUTANEOUS
  Administered 2015-01-29: 3 [IU] via SUBCUTANEOUS

## 2015-01-28 MED ORDER — DIPHENHYDRAMINE HCL 12.5 MG/5ML PO ELIX
12.5000 mg | ORAL_SOLUTION | ORAL | Status: DC | PRN
  Administered 2015-01-30: 25 mg via ORAL
  Filled 2015-01-28: qty 10

## 2015-01-28 MED ORDER — FLEET ENEMA 7-19 GM/118ML RE ENEM: 1.0000 | ENEMA | Freq: Once | RECTAL | Status: DC | PRN

## 2015-01-28 SURGICAL SUPPLY — 42 items
BLADE SAW SGTL 18X1.27X75 (BLADE) ×2 IMPLANT
BLADE SAW SGTL 18X1.27X75MM (BLADE) ×1
CAPT HIP TOTAL 2 ×2 IMPLANT
CLOSURE STERI-STRIP 1/2X4 (GAUZE/BANDAGES/DRESSINGS) ×1
CLSR STERI-STRIP ANTIMIC 1/2X4 (GAUZE/BANDAGES/DRESSINGS) ×3 IMPLANT
DECANTER SPIKE VIAL GLASS SM (MISCELLANEOUS) ×3 IMPLANT
DRAPE C-ARM 42X72 X-RAY (DRAPES) ×3 IMPLANT
DRAPE IMP U-DRAPE 54X76 (DRAPES) ×3 IMPLANT
DRAPE STERI IOBAN 125X83 (DRAPES) ×3 IMPLANT
DRAPE U-SHAPE 47X51 STRL (DRAPES) ×9 IMPLANT
DRSG MEPILEX BORDER 4X4 (GAUZE/BANDAGES/DRESSINGS) ×2 IMPLANT
DRSG MEPILEX BORDER 4X8 (GAUZE/BANDAGES/DRESSINGS) ×3 IMPLANT
DURAPREP 26ML APPLICATOR (WOUND CARE) ×3 IMPLANT
ELECT BLADE 6.5 EXT (BLADE) ×3 IMPLANT
ELECT REM PT RETURN 9FT ADLT (ELECTROSURGICAL) ×3
ELECTRODE REM PT RTRN 9FT ADLT (ELECTROSURGICAL) ×1 IMPLANT
EVACUATOR 1/8 PVC DRAIN (DRAIN) ×3 IMPLANT
FACESHIELD WRAPAROUND (MASK) ×6 IMPLANT
FACESHIELD WRAPAROUND OR TEAM (MASK) ×2 IMPLANT
GLOVE BIO SURGEON STRL SZ7.5 (GLOVE) ×3 IMPLANT
GLOVE BIO SURGEON STRL SZ8 (GLOVE) ×3 IMPLANT
GLOVE BIOGEL PI IND STRL 8 (GLOVE) ×2 IMPLANT
GLOVE BIOGEL PI INDICATOR 8 (GLOVE) ×4
GOWN STRL REUS W/ TWL LRG LVL3 (GOWN DISPOSABLE) ×1 IMPLANT
GOWN STRL REUS W/ TWL XL LVL3 (GOWN DISPOSABLE) ×1 IMPLANT
GOWN STRL REUS W/TWL LRG LVL3 (GOWN DISPOSABLE) ×3
GOWN STRL REUS W/TWL XL LVL3 (GOWN DISPOSABLE) ×3
KIT BASIN OR (CUSTOM PROCEDURE TRAY) ×3 IMPLANT
NDL SAFETY ECLIPSE 18X1.5 (NEEDLE) ×1 IMPLANT
NEEDLE HYPO 18GX1.5 SHARP (NEEDLE) ×3
PACK TOTAL JOINT (CUSTOM PROCEDURE TRAY) ×3 IMPLANT
PACK UNIVERSAL I (CUSTOM PROCEDURE TRAY) ×3 IMPLANT
SUT ETHIBOND NAB CT1 #1 30IN (SUTURE) ×3 IMPLANT
SUT MNCRL AB 4-0 PS2 18 (SUTURE) ×3 IMPLANT
SUT VIC AB 1 CT1 27 (SUTURE) ×3
SUT VIC AB 1 CT1 27XBRD ANTBC (SUTURE) ×1 IMPLANT
SUT VIC AB 2-0 CT1 27 (SUTURE) ×6
SUT VIC AB 2-0 CT1 TAPERPNT 27 (SUTURE) ×2 IMPLANT
SUT VLOC 180 0 24IN GS25 (SUTURE) ×3 IMPLANT
SYR CONTROL 10ML LL (SYRINGE) ×3 IMPLANT
TOWEL OR 17X26 10 PK STRL BLUE (TOWEL DISPOSABLE) ×6 IMPLANT
TRAY FOLEY CATH 16FR SILVER (SET/KITS/TRAYS/PACK) ×3 IMPLANT

## 2015-01-28 NOTE — Anesthesia Preprocedure Evaluation (Signed)
Anesthesia Evaluation  Patient identified by MRN, date of birth, ID band Patient awake    Reviewed: Allergy & Precautions, NPO status , Patient's Chart, lab work & pertinent test results  Airway Mallampati: II   Neck ROM: full    Dental   Pulmonary Current Smoker,    breath sounds clear to auscultation       Cardiovascular hypertension,  Rhythm:regular Rate:Normal     Neuro/Psych Anxiety Depression    GI/Hepatic GERD  ,  Endo/Other  diabetes, Type 2  Renal/GU      Musculoskeletal  (+) Arthritis ,   Abdominal   Peds  Hematology   Anesthesia Other Findings   Reproductive/Obstetrics                             Anesthesia Physical Anesthesia Plan  ASA: II  Anesthesia Plan: MAC and Spinal   Post-op Pain Management:    Induction: Intravenous  Airway Management Planned: Simple Face Mask  Additional Equipment:   Intra-op Plan:   Post-operative Plan:   Informed Consent: I have reviewed the patients History and Physical, chart, labs and discussed the procedure including the risks, benefits and alternatives for the proposed anesthesia with the patient or authorized representative who has indicated his/her understanding and acceptance.     Plan Discussed with: CRNA, Anesthesiologist and Surgeon  Anesthesia Plan Comments:         Anesthesia Quick Evaluation

## 2015-01-28 NOTE — Anesthesia Postprocedure Evaluation (Signed)
Anesthesia Post Note  Patient: Debra Woods  Procedure(s) Performed: Procedure(s) (LRB): LEFT TOTAL HIP ARTHROPLASTY ANTERIOR APPROACH (Left)  Patient location during evaluation: PACU Anesthesia Type: Spinal Level of consciousness: oriented and awake and alert Pain management: pain level controlled Vital Signs Assessment: post-procedure vital signs reviewed and stable Respiratory status: spontaneous breathing, respiratory function stable and patient connected to nasal cannula oxygen Cardiovascular status: blood pressure returned to baseline and stable Postop Assessment: no headache and no backache Anesthetic complications: no    Last Vitals:  Filed Vitals:   01/28/15 1445 01/28/15 1500  BP: 115/70 99/59  Pulse: 74 73  Temp:    Resp: 14 18    Last Pain:  Filed Vitals:   01/28/15 1508  PainSc: 5                  Youcef Klas S

## 2015-01-28 NOTE — Transfer of Care (Signed)
Immediate Anesthesia Transfer of Care Note  Patient: Debra Woods  Procedure(s) Performed: Procedure(s): LEFT TOTAL HIP ARTHROPLASTY ANTERIOR APPROACH (Left)  Patient Location: PACU  Anesthesia Type:MAC and Spinal  Level of Consciousness: awake, alert , oriented and patient cooperative  Airway & Oxygen Therapy: Patient Spontanous Breathing and Patient connected to face mask oxygen  Post-op Assessment: Report given to RN and Post -op Vital signs reviewed and stable  Post vital signs: Reviewed and stable  Last Vitals:  Filed Vitals:   01/28/15 1019 01/28/15 1035  BP: 139/70 122/69  Pulse: 93   Temp: 36.9 C   Resp: 20     Complications: No apparent anesthesia complications

## 2015-01-28 NOTE — Interval H&P Note (Signed)
History and Physical Interval Note:  01/28/2015 10:43 AM  Debra Woods  has presented today for surgery, with the diagnosis of OA LEFT HIP  The various methods of treatment have been discussed with the patient and family. After consideration of risks, benefits and other options for treatment, the patient has consented to  Procedure(s): LEFT TOTAL HIP ARTHROPLASTY ANTERIOR APPROACH (Left) as a surgical intervention .  The patient's history has been reviewed, patient examined, no change in status, stable for surgery.  I have reviewed the patient's chart and labs.  Questions were answered to the patient's satisfaction.     Loanne Drilling

## 2015-01-28 NOTE — Op Note (Signed)
OPERATIVE REPORT  PREOPERATIVE DIAGNOSIS: Osteoarthritis of the Left hip.   POSTOPERATIVE DIAGNOSIS: Osteoarthritis of the Left  hip.   PROCEDURE: Left total hip arthroplasty, anterior approach.   SURGEON: Ollen Gross, MD   ASSISTANT: Avel Peace, PA-C  ANESTHESIA:  Spinal  ESTIMATED BLOOD LOSS:-200 ml  DRAINS: Hemovac x1.   COMPLICATIONS: None   CONDITION: PACU - hemodynamically stable.   BRIEF CLINICAL NOTE: Debra Woods is a 60 y.o. female who has advanced end-  stage arthritis of her Left  hip with progressively worsening pain and  dysfunction.The patient has failed nonoperative management and presents for  total hip arthroplasty.   PROCEDURE IN DETAIL: After successful administration of spinal  anesthetic, the traction boots for the [facility] bed were placed on both  feet and the patient was placed onto the [facility] bed, boots placed into the leg  holders. The Left hip was then isolated from the perineum with plastic  drapes and prepped and draped in the usual sterile fashion. ASIS and  greater trochanter were marked and a oblique incision was made, starting  at about 1 cm lateral and 2 cm distal to the ASIS and coursing towards  the anterior cortex of the femur. The skin was cut with a 10 blade  through subcutaneous tissue to the level of the fascia overlying the  tensor fascia lata muscle. The fascia was then incised in line with the  incision at the junction of the anterior third and posterior 2/3rd. The  muscle was teased off the fascia and then the interval between the TFL  and the rectus was developed. The Hohmann retractor was then placed at  the top of the femoral neck over the capsule. The vessels overlying the  capsule were cauterized and the fat on top of the capsule was removed.  A Hohmann retractor was then placed anterior underneath the rectus  femoris to give exposure to the entire anterior capsule. A T-shaped  capsulotomy was performed. The  edges were tagged and the femoral head  was identified.       Osteophytes are removed off the superior acetabulum.  The femoral neck was then cut in situ with an oscillating saw. Traction  was then applied to the left lower extremity utilizing the [facility]  traction. The femoral head was then removed. Retractors were placed  around the acetabulum and then circumferential removal of the labrum was  performed. Osteophytes were also removed. Reaming starts at 43 mm to  medialize and  Increased in 2 mm increments to 47 mm. We reamed in  approximately 40 degrees of abduction, 20 degrees anteversion. A 48 mm  pinnacle acetabular shell was then impacted in anatomic position under  fluoroscopic guidance with excellent purchase. We did not need to place  any additional dome screws. A 28 mm neutral + 4 marathon liner was then  placed into the acetabular shell.       The femoral lift was then placed along the lateral aspect of the femur  just distal to the vastus ridge. The leg was  externally rotated and capsule  was stripped off the inferior aspect of the femoral neck down to the  level of the lesser trochanter, this was done with electrocautery. The femur was lifted after this was performed. The  leg was then placed and extended in adducted position to essentially delivering the femur. We also removed the capsule superiorly and the  piriformis from the piriformis fossa  to gain excellent exposure of the  proximal femur. Rongeur was used to remove some cancellous bone to get  into the lateral portion of the proximal femur for placement of the  initial starter reamer. The starter broaches was placed  the starter broach  and was shown to go down the center of the canal. Broaching  with the  Corail system was then performed starting at size 8, coursing  Up to size 9. A size 9 had excellent torsional and rotational  and axial stability. The trial high offset neck was then placed  with a 28 + 1.5 trial head.  The hip was then reduced. We confirmed that  the stem was in the canal both on AP and lateral x-rays. It also has excellent sizing. The hip was reduced with outstanding stability through full extension, full external rotation,  and then flexion in adduction internal rotation. AP pelvis was taken  and the leg lengths were measured and found to be exactly equal. Hip  was then dislocated again and the femoral head and neck removed. The  femoral broach was removed. Size 9 Corail stem with a high offset  neck was then impacted into the femur following native anteversion. Has  excellent purchase in the canal. Excellent torsional and rotational and  axial stability. It is confirmed to be in the canal on AP and lateral  fluoroscopic views. The 28 + 1.5 ceramic head was placed and the hip  reduced with outstanding stability. Again AP pelvis was taken and it  confirmed that the leg lengths were equal. The wound was then copiously  irrigated with saline solution and the capsule reattached and repaired  with Ethibond suture. 30 ml of .25% Bupivicaine injected into the capsule and into the edge of the tensor fascia lata as well as subcutaneous tissue. The fascia overlying the tensor fascia lata was  then closed with a running #1 V-Loc. Subcu was closed with interrupted  2-0 Vicryl and subcuticular running 4-0 Monocryl. Incision was cleaned  and dried. Steri-Strips and a bulky sterile dressing applied. Hemovac  drain was hooked to suction and then he was awakened and transported to  recovery in stable condition.        Please note that a surgical assistant was a medical necessity for this procedure to perform it in a safe and expeditious manner. Assistant was necessary to provide appropriate retraction of vital neurovascular structures and to prevent femoral fracture and allow for anatomic placement of the prosthesis.  Ollen Gross, M.D.

## 2015-01-28 NOTE — H&P (View-Only) (Signed)
Debra Woods DOB: [birth year] Married / Language: English / Race: Black or African American Female Date of Admission:  01/28/2015 CC:  Left Hip Pain History of Present Illness The patient is a 60 year old female who comes in today for a preoperative History and Physical. The patient is scheduled for a left total hip arthroplasty (anterior) to be performed by Dr. [name]. [name], MD at [facility] on 01-28-2015. The patient reported left hip problems including pain (pt. can't walk well), giving way, catching (locking) and popping symptoms that have been present for 1 year(s). The symptoms began without any known injury. The patient reports symptoms radiating to the: right thigh (lateral thigh). The patient describes the hip problem as sharp, dull, aching, throbbing, grinding, stabbing and burning.The symptoms are described as moderate in severity.The patient feels as if their symptoms are does feel they are worsening. Current treatment includes use of a walker ((cane)) and nonsteroidal anti-inflammatory drugs (hydrocodone for pain.). Previous workup for this problem has included hip x-rays. Previous treatment for this problem has included nonsteroidal anti-inflammatory drugs (also, cortisone injection in her left hip.-injection helped some). Note for "Hip problem": diagnosed with OA in left hip. She has had severe problems with that hip for over a year now. She has some discomfort prior to that, but it has gotten much worse in the past year. Left hip hurts more than the right. It is limiting what she can and cannot do. She tries to do most of her activities and push through the pain and is able to do so on most occasions, but on other times, it is very difficult to push through. She has had two intraarticular injections, the first of which helped for several months, second of which helped for short amount of time. Pain is in the left groin radiating towards the knee. Right side also hurts, but to a  lesser degree. She has had a significant loss of motion and function which has gotten progressively worse in the past year. She has also loss of movement on the right side. She is at a stage where her hips essentially taking over her life and she is ready to proceed with surgery at this time. They have been treated conservatively in the past for the above stated problem and despite conservative measures, they continue to have progressive pain and severe functional limitations and dysfunction. They have failed non-operative management including home exercise, medications, and injections. It is felt that they would benefit from undergoing total joint replacement. Risks and benefits of the procedure have been discussed with the patient and they elect to proceed with surgery. There are no active contraindications to surgery such as ongoing infection or rapidly progressive neurological disease.  Problem List/Past Medical  Primary osteoarthritis of left hip (M16.12)  Impaired Vision  Depression  High blood pressure  Hypercholesterolemia  Gastroesophageal Reflux Disease  Diabetes Mellitus, Type II  Menopause  Allergies No Known Drug Allergies  Family History Cerebrovascular Accident  father Diabetes Mellitus  mother, father and sister Heart Disease  father Heart disease in female family member before age 58  Hypertension  mother, father and sister Rheumatoid Arthritis  mother  Social History Alcohol use  current drinker; drinks wine; 5-7 per week Children  2 Current work status  working full time Drug/Alcohol Rehab (Currently)  no Drug/Alcohol Rehab (Previously)  no Illicit drug use  no Living situation  live with spouse Marital status  married Number of flights of stairs before winded  2-3 Pain Contract  no Tobacco / smoke exposure  yes outdoors only Tobacco use  current some days smoker; smoke(d) less than 1/2 pack(s) per day Post-Surgical Plans   Home  Medication History MetFORMIN HCl (  Tablet, Oral two times daily) Active. Diclofenac Sodium (  Tablet DR, Oral daily) Active. BuPROPion HCl ER (XL) (  Tablet ER 24HR, Oral daily) Active. (four days a week) Pantoprazole Sodium (  Tablet DR, Oral daily) Active. Atorvastatin Calcium (  Tablet, Oral daily) Active. Lisinopril-Hydrochlorothiazide (10-12.5MG  Tablet, Oral daily) Active. Hydrocodone-Acetaminophen (5-325MG  Tablet, Oral) Active. Zolpidem Tartrate (  Tablet, 1/2 tab Oral) Active. FreeStyle Lite Test (In Vitro) Active.  Past Surgical History Hysterectomy  partial (non-cancerous) Tubal Ligation   Review of Systems General Not Present- Chills, Fatigue, Fever, Memory Loss, Night Sweats, Weight Gain and Weight Loss. Skin Not Present- Eczema, Hives, Itching, Lesions and Rash. HEENT Not Present- Dentures, Double Vision, Headache, Hearing Loss, Tinnitus and Visual Loss. Respiratory Not Present- Allergies, Chronic Cough, Coughing up blood, Shortness of breath at rest and Shortness of breath with exertion. Cardiovascular Not Present- Chest Pain, Difficulty Breathing Lying Down, Murmur, Palpitations, Racing/skipping heartbeats and Swelling. Gastrointestinal Not Present- Abdominal Pain, Bloody Stool, Constipation, Diarrhea, Difficulty Swallowing, Heartburn, Jaundice, Loss of appetitie, Nausea and Vomiting. Female Genitourinary Present- Urinating at Night. Not Present- Blood in Urine, Discharge, Flank Pain, Incontinence, Painful Urination, Urgency, Urinary frequency, Urinary Retention and Weak urinary stream. Musculoskeletal Not Present- Back Pain, Joint Pain, Joint Swelling, Morning Stiffness, Muscle Pain, Muscle Weakness and Spasms. Neurological Not Present- Blackout spells, Difficulty with balance, Dizziness, Paralysis, Tremor and Weakness. Psychiatric Not Present- Insomnia.  Vitals Weight: 165 lb Height: 62in Weight was reported by patient. Height  was reported by patient. Body Surface Area: 1.76 m Body Mass Index: 30.18 kg/m  BP: 122/76 (Sitting, Left Arm, Standard)  Physical Exam General Mental Status -Alert, cooperative and good historian. General Appearance-pleasant, Not in acute distress. Orientation-Oriented X3. Build & Nutrition-Well nourished and Well developed.  Head and Neck Head-normocephalic, atraumatic . Neck Global Assessment - supple, no bruit auscultated on the right, no bruit auscultated on the left.  Eye Pupil - Bilateral-Regular and Round. Motion - Bilateral-EOMI.  ENMT Note: upper partial denture plate   Chest and Lung Exam Auscultation Breath sounds - clear at anterior chest wall and clear at posterior chest wall. Adventitious sounds - No Adventitious sounds.  Cardiovascular Auscultation Rhythm - Regular rate and rhythm. Heart Sounds - S1 WNL and S2 WNL. Murmurs & Other Heart Sounds - Auscultation of the heart reveals - No Murmurs.  Abdomen Palpation/Percussion Tenderness - Abdomen is non-tender to palpation. Rigidity (guarding) - Abdomen is soft. Auscultation Auscultation of the abdomen reveals - Bowel sounds normal.  Female Genitourinary Note: Not done, not pertinent to present illness   Musculoskeletal Note: Very pleasant well-developed female, alert and oriented, in no apparent distress. Evaluation of the left hip shows flexion to about 70, no internal rotation and in fact she has got about 20 degree external rotation contracture. She can externally rotate beyond that. She can only abduct about 20. Right hip flexion about 90, minimal internal rotation, about 30 external rotation and 30 abduction. Knee exams are normal. Pulse, sensation and motor intact both lower extremities.  RADIOGRAPHS AP pelvis and lateral of the hips show severe end-stage arthritis, left worse than right hip. She has got complete bone on bone on the left with large subchondral cyst. This is a  marked progression from a year and half ago. Her right hip is just about bone  on bone also with very Baranski joint space left.  Assessment & Plan Primary osteoarthritis of left hip (M16.12)  Note:Surgical Plans: Left Total Hip Replacement - Anterior Approach  Disposition: Home  PCP: Dr. Erby Pian - Patient has been seen preoperatively and felt to be stable for surgery.  IV TXA  Anesthesia Issues: Cardiac Arrest following the Hysterectomy (approx. 14 years ago).  Signed electronically by Lauraine Rinne, III PA-C

## 2015-01-28 NOTE — Discharge Instructions (Signed)
° °Dr. Demetria Lightsey °Total Joint Specialist °[facility] °[address] °[place], [place] [address] °[phone] ° °ANTERIOR APPROACH TOTAL HIP REPLACEMENT POSTOPERATIVE DIRECTIONS ° ° °Hip Rehabilitation, Guidelines Following Surgery  °The results of a hip operation are greatly improved after range of motion and muscle strengthening exercises. Follow all safety measures which are given to protect your hip. If any of these exercises cause increased pain or swelling in your joint, decrease the amount until you are comfortable again. Then slowly increase the exercises. Call your caregiver if you have problems or questions.  ° °HOME CARE INSTRUCTIONS  °Remove items at home which could result in a fall. This includes throw rugs or furniture in walking pathways.  °· ICE to the affected hip every three hours for 30 minutes at a time and then as needed for pain and swelling.  Continue to use ice on the hip for pain and swelling from surgery. You may notice swelling that will progress down to the foot and ankle.  This is normal after surgery.  Elevate the leg when you are not up walking on it.   °· Continue to use the breathing machine which will help keep your temperature down.  It is common for your temperature to cycle up and down following surgery, especially at night when you are not up moving around and exerting yourself.  The breathing machine keeps your lungs expanded and your temperature down. ° ° °DIET °You may resume your previous home diet once your are discharged from the hospital. ° °DRESSING / WOUND CARE / SHOWERING °You may start showering once you are discharged home but do not submerge the incision under water. Just pat the incision dry and apply a dry gauze dressing on daily. °Change the surgical dressing daily and reapply a dry dressing each time. ° °ACTIVITY °Walk with your walker as instructed. °Use walker as long as suggested by your caregivers. °Avoid periods of inactivity  such as sitting longer than an hour when not asleep. This helps prevent blood clots.  °You may resume a sexual relationship in one month or when given the OK by your doctor.  °You may return to work once you are cleared by your doctor.  °Do not drive a car for 6 weeks or until released by you surgeon.  °Do not drive while taking narcotics. ° °WEIGHT BEARING °Weight bearing as tolerated with assist device (walker, cane, etc) as directed, use it as long as suggested by your surgeon or therapist, typically at least 4-6 weeks. ° °POSTOPERATIVE CONSTIPATION PROTOCOL °Constipation - defined medically as fewer than three stools per week and severe constipation as less than one stool per week. ° °One of the most common issues patients have following surgery is constipation.  Even if you have a regular bowel pattern at home, your normal regimen is likely to be disrupted due to multiple reasons following surgery.  Combination of anesthesia, postoperative narcotics, change in appetite and fluid intake all can affect your bowels.  In order to avoid complications following surgery, here are some recommendations in order to help you during your recovery period. ° °Colace (docusate) - Pick up an over-the-counter form of Colace or another stool softener and take twice a day as long as you are requiring postoperative pain medications.  Take with a full glass of water daily.  If you experience loose stools or diarrhea, hold the colace until you stool forms back up.  If your symptoms do not get better within 1   week or if they get worse, check with your doctor. ° °Dulcolax (bisacodyl) - Pick up over-the-counter and take as directed by the product packaging as needed to assist with the movement of your bowels.  Take with a full glass of water.  Use this product as needed if not relieved by Colace only.  ° °MiraLax (polyethylene glycol) - Pick up over-the-counter to have on hand.  MiraLax is a solution that will increase the amount of  water in your bowels to assist with bowel movements.  Take as directed and can mix with a glass of water, juice, soda, coffee, or tea.  Take if you go more than two days without a movement. °Do not use MiraLax more than once per day. Call your doctor if you are still constipated or irregular after using this medication for 7 days in a row. ° °If you continue to have problems with postoperative constipation, please contact the office for further assistance and recommendations.  If you experience "the worst abdominal pain ever" or develop nausea or vomiting, please contact the office immediatly for further recommendations for treatment. ° °ITCHING ° If you experience itching with your medications, try taking only a single pain pill, or even half a pain pill at a time.  You can also use Benadryl over the counter for itching or also to help with sleep.  ° °TED HOSE STOCKINGS °Wear the elastic stockings on both legs for three weeks following surgery during the day but you may remove then at night for sleeping. ° °MEDICATIONS °See your medication summary on the “After Visit Summary” that the nursing staff will review with you prior to discharge.  You may have some home medications which will be placed on hold until you complete the course of blood thinner medication.  It is important for you to complete the blood thinner medication as prescribed by your surgeon.  Continue your approved medications as instructed at time of discharge. ° °PRECAUTIONS °If you experience chest pain or shortness of breath - call 911 immediately for transfer to the hospital emergency department.  °If you develop a fever greater that 101 F, purulent drainage from wound, increased redness or drainage from wound, foul odor from the wound/dressing, or calf pain - CONTACT YOUR SURGEON.   °                                                °FOLLOW-UP APPOINTMENTS °Make sure you keep all of your appointments after your operation with your surgeon and  caregivers. You should call the office at the above phone number and make an appointment for approximately two weeks after the date of your surgery or on the date instructed by your surgeon outlined in the "After Visit Summary". ° °RANGE OF MOTION AND STRENGTHENING EXERCISES  °These exercises are designed to help you keep full movement of your hip joint. Follow your caregiver's or physical therapist's instructions. Perform all exercises about fifteen times, three times per day or as directed. Exercise both hips, even if you have had only one joint replacement. These exercises can be done on a training (exercise) mat, on the floor, on a table or on a bed. Use whatever works the best and is most comfortable for you. Use music or television while you are exercising so that the exercises are a pleasant break in your day. This   will make your life better with the exercises acting as a break in routine you can look forward to.  °Lying on your back, slowly slide your foot toward your buttocks, raising your knee up off the floor. Then slowly slide your foot back down until your leg is straight again.  °Lying on your back spread your legs as far apart as you can without causing discomfort.  °Lying on your side, raise your upper leg and foot straight up from the floor as far as is comfortable. Slowly lower the leg and repeat.  °Lying on your back, tighten up the muscle in the front of your thigh (quadriceps muscles). You can do this by keeping your leg straight and trying to raise your heel off the floor. This helps strengthen the largest muscle supporting your knee.  °Lying on your back, tighten up the muscles of your buttocks both with the legs straight and with the knee bent at a comfortable angle while keeping your heel on the floor.  ° °IF YOU ARE TRANSFERRED TO A SKILLED REHAB FACILITY °If the patient is transferred to a skilled rehab facility following release from the hospital, a list of the current medications will be  sent to the facility for the patient to continue.  When discharged from the skilled rehab facility, please have the facility set up the patient's Home Health Physical Therapy prior to being released. Also, the skilled facility will be responsible for providing the patient with their medications at time of release from the facility to include their pain medication, the muscle relaxants, and their blood thinner medication. If the patient is still at the rehab facility at time of the two week follow up appointment, the skilled rehab facility will also need to assist the patient in arranging follow up appointment in our office and any transportation needs. ° °MAKE SURE YOU:  °Understand these instructions.  °Get help right away if you are not doing well or get worse.  ° ° °Pick up stool softner and laxative for home use following surgery while on pain medications. °Do not submerge incision under water. °Please use good hand washing techniques while changing dressing each day. °May shower starting three days after surgery. °Please use a clean towel to pat the incision dry following showers. °Continue to use ice for pain and swelling after surgery. °Do not use any lotions or creams on the incision until instructed by your surgeon. °

## 2015-01-28 NOTE — Anesthesia Procedure Notes (Signed)
Spinal Patient location during procedure: OR Start time: 01/28/2015 12:01 PM End time: 01/28/2015 12:07 PM Staffing Anesthesiologist: Joram Venson Performed by: anesthesiologist  Preanesthetic Checklist Completed: patient identified, site marked, surgical consent, pre-op evaluation, timeout performed, IV checked, risks and benefits discussed and monitors and equipment checked Spinal Block Patient position: sitting Prep: Betadine Patient monitoring: heart rate, cardiac monitor, continuous pulse ox and blood pressure Approach: midline Location: L2-3 Injection technique: single-shot Needle Needle type: Pencan  Needle gauge: 24 G Needle length: 9 cm Assessment Sensory level: T10 Additional Notes Pt tolerated the procedure well.

## 2015-01-29 LAB — BASIC METABOLIC PANEL
Anion gap: 10 (ref 5–15)
BUN: 15 mg/dL (ref 6–20)
CO2: 24 mmol/L (ref 22–32)
CREATININE: 1.07 mg/dL — AB (ref 0.44–1.00)
Calcium: 8.9 mg/dL (ref 8.9–10.3)
Chloride: 104 mmol/L (ref 101–111)
GFR calc Af Amer: >60 mL/min (ref >60)
GFR, EST NON AFRICAN AMERICAN: 56 mL/min — AB (ref >60)
GLUCOSE: 212 mg/dL — AB (ref 65–99)
Potassium: 4.8 mmol/L (ref 3.5–5.1)
SODIUM: 138 mmol/L (ref 135–145)

## 2015-01-29 LAB — CBC
HCT: 31.4 % — ABNORMAL LOW (ref 36.0–46.0)
Hemoglobin: 10.1 g/dL — ABNORMAL LOW (ref 12.0–15.0)
MCH: 27 pg (ref 26.0–34.0)
MCHC: 32.2 g/dL (ref 30.0–36.0)
MCV: 84 fL (ref 78.0–100.0)
PLATELETS: 314 10*3/uL (ref 150–400)
RBC: 3.74 MIL/uL — ABNORMAL LOW (ref 3.87–5.11)
RDW: 13.3 % (ref 11.5–15.5)
WBC: 13.4 10*3/uL — ABNORMAL HIGH (ref 4.0–10.5)

## 2015-01-29 LAB — GLUCOSE, CAPILLARY
GLUCOSE-CAPILLARY: 152 mg/dL — AB (ref 65–99)
GLUCOSE-CAPILLARY: 186 mg/dL — AB (ref 65–99)
GLUCOSE-CAPILLARY: 242 mg/dL — AB (ref 65–99)
Glucose-Capillary: 260 mg/dL — ABNORMAL HIGH (ref 65–99)
Glucose-Capillary: 175 mg/dL — ABNORMAL HIGH (ref 65–99)
Glucose-Capillary: 228 mg/dL — ABNORMAL HIGH (ref 65–99)

## 2015-01-29 MED ORDER — METHOCARBAMOL 500 MG PO TABS: 500.0000 mg | ORAL_TABLET | Freq: Four times a day (QID) | ORAL | Status: DC | PRN

## 2015-01-29 MED ORDER — RIVAROXABAN 10 MG PO TABS: 10.0000 mg | ORAL_TABLET | Freq: Every day | ORAL | Status: DC

## 2015-01-29 MED ORDER — OXYCODONE HCL 5 MG PO TABS: 5.0000 mg | ORAL_TABLET | ORAL | Status: DC | PRN

## 2015-01-29 NOTE — Evaluation (Signed)
Physical Therapy Evaluation Patient Details Name: Debra Woods MRN: [id number] DOB: 28-Jul-1955 Today's Date: 01/29/2015   History of Present Illness  This 60 y.o. female admitted for Lt direct anterior THA.  PMH includes:  OA Rt hip, DM  Clinical Impression  Patient presents with problems listed below.  Will benefit from acute PT to maximize functional mobility prior to discharge home with husband.  Recommend f/u HHPT for continued therapy at discharge.  Will initiate stair training in am.    Follow Up Recommendations Home health PT;Supervision for mobility/OOB    Equipment Recommendations  None recommended by PT    Recommendations for Other Services       Precautions / Restrictions Precautions Precautions: None Restrictions Weight Bearing Restrictions: Yes LLE Weight Bearing: Weight bearing as tolerated      Mobility  Bed Mobility Overal bed mobility: Needs Assistance Bed Mobility: Supine to Sit;Sit to Supine     Supine to sit: Min guard;HOB elevated Sit to supine: Min assist   General bed mobility comments: Verbal cues for technique.  Increased time for all bed mobility.  Assist for safety only to move to sitting.  Good sitting balance.  Assist to bring LE's onto bed to return to supine.  Transfers Overall transfer level: Needs assistance Equipment used: Rolling walker (2 wheeled) Transfers: Sit to/from Stand Sit to Stand: Min guard         General transfer comment: Verbal cues for hand placement.  No physical assist needed.  Ambulation/Gait Ambulation/Gait assistance: Min guard Ambulation Distance (Feet): 170 Feet Assistive device: Rolling walker (2 wheeled) Gait Pattern/deviations: Step-through pattern;Decreased stance time - left;Decreased step length - right;Decreased stride length;Decreased weight shift to left;Antalgic Gait velocity: decreased Gait velocity interpretation: Below normal speed for age/gender General Gait Details: Verbal cues for safe  use of RW.  Slightly antalgic gait on LLE.  Good balance with RW.  Stairs            Wheelchair Mobility    Modified Rankin (Stroke Patients Only)       Balance                                             Pertinent Vitals/Pain Pain Assessment: 0-10 Pain Score: 5  Pain Location: Lt hip Pain Descriptors / Indicators: Aching Pain Intervention(s): Monitored during session;Repositioned    Home Living Family/patient expects to be discharged to:: Private residence Living Arrangements: Spouse/significant other;Children Available Help at Discharge: Family;Available 24 hours/day Type of Home: House Home Access: Stairs to enter Entrance Stairs-Rails: Left (Wall on Rt) Entrance Stairs-Number of Steps: 4 Home Layout: Two level;1/2 bath on main level;Bed/bath upstairs Home Equipment: Walker - 4 wheels;Bedside commode;Shower seat;Grab bars - toilet;Adaptive equipment;Cane - single point      Prior Function Level of Independence: Needs assistance   Gait / Transfers Assistance Needed: Pt ambulated mod I using two canes   ADL's / Homemaking Assistance Needed: Pt required assist for shoes and occasional socks.  She has AE        Hand Dominance   Dominant Hand: Right    Extremity/Trunk Assessment   Upper Extremity Assessment: Defer to OT evaluation           Lower Extremity Assessment: LLE deficits/detail   LLE Deficits / Details: Strength grossly 4-/5.  Cervical / Trunk Assessment: Normal  Communication   Communication: No difficulties  Cognition  Arousal/Alertness: Awake/alert Behavior During Therapy: WFL for tasks assessed/performed Overall Cognitive Status: Within Functional Limits for tasks assessed                      General Comments      Exercises Total Joint Exercises Ankle Circles/Pumps: AROM;Both;5 reps;Supine Quad Sets: AROM;Both;5 reps;Supine      Assessment/Plan    PT Assessment Patient needs continued PT  services  PT Diagnosis Difficulty walking;Acute pain   PT Problem List Decreased strength;Decreased balance;Decreased mobility;Decreased knowledge of use of DME;Pain  PT Treatment Interventions DME instruction;Gait training;Stair training;Functional mobility training;Therapeutic activities;Therapeutic exercise;Patient/family education   PT Goals (Current goals can be found in the Care Plan section) Acute Rehab PT Goals Patient Stated Goal: to regain independence  PT Goal Formulation: With patient/family Time For Goal Achievement: 02/05/15 Potential to Achieve Goals: Good    Frequency 7X/week   Barriers to discharge Inaccessible home environment Bed/bath upstairs with 14 steps.    Co-evaluation               End of Session Equipment Utilized During Treatment: Gait belt Activity Tolerance: Patient tolerated treatment well Patient left: in bed;with call bell/phone within reach;with family/visitor present Nurse Communication: Mobility status         Time: 1715-1743 PT Time Calculation (min) (ACUTE ONLY): 28 min   Charges:   PT Evaluation $PT Eval Moderate Complexity: 1 Procedure PT Treatments $Gait Training: 8-22 mins   PT G CodesVena Austria 17-Feb-2015, 6:17 PM Durenda Hurt. Renaldo Fiddler, [facility] Acute Rehab Services Pager [phone]

## 2015-01-29 NOTE — Progress Notes (Signed)
   Subjective: 1 Day Post-Op Procedure(s) (LRB): LEFT TOTAL HIP ARTHROPLASTY ANTERIOR APPROACH (Left) Patient reports pain as mild.   We will start therapy today.  Plan is to go Home after hospital stay.  Objective: Vital signs in last 24 hours: Temp:  [97.2 F (36.2 C)-98.4 F (36.9 C)] 98.2 F (36.8 C) (01/28 0706) Pulse Rate:  [72-98] 98 (01/28 0706) Resp:  [14-22] 18 (01/28 0706) BP: (86-139)/(51-82) 118/65 mmHg (01/28 0706) SpO2:  [96 %-100 %] 97 % (01/28 0706)  Intake/Output from previous day:  Intake/Output Summary (Last 24 hours) at 01/29/15 0743 Last data filed at 01/29/15 0707  Gross per 24 hour  Intake   1750 ml  Output   2325 ml  Net   -575 ml    Intake/Output this shift: Total I/O In: -  Out: 450 [Urine:450]  Labs: No results for input(s): HGB in the last 72 hours. No results for input(s): WBC, RBC, HCT, PLT in the last 72 hours. No results for input(s): NA, K, CL, CO2, BUN, CREATININE, GLUCOSE, CALCIUM in the last 72 hours. No results for input(s): LABPT, INR in the last 72 hours.  EXAM General - Patient is Alert, Appropriate and Oriented Extremity - Neurologically intact Neurovascular intact No cellulitis present Compartment soft Dressing - dressing C/D/I Motor Function - intact, moving foot and toes well on exam.  Hemovac pulled without difficulty.  Past Medical History  Diagnosis Date  . GERD (gastroesophageal reflux disease)   . Hypertension   . Depression   . Complication of anesthesia     cpr was done, pt quit breathing 06/04/01 code note states: patient on Morphine PCA and IV toradol post hysterectomy, found apneic and responded to Narcan  . Hyperlipidemia   . Type II diabetes mellitus (HCC)   . Arthritis     "hips" (01/28/2015)    Assessment/Plan: 1 Day Post-Op Procedure(s) (LRB): LEFT TOTAL HIP ARTHROPLASTY ANTERIOR APPROACH (Left) Principal Problem:   OA (osteoarthritis) of hip   Advance diet Up with therapy D/C IV  fluids Plan for discharge tomorrow  DVT Prophylaxis - Xarelto Weight Bearing As Tolerated left Leg Hemovac Pulled Begin Therapy   Debra Woods

## 2015-01-29 NOTE — Evaluation (Signed)
Occupational Therapy Evaluation Patient Details Name: Debra Woods MRN: [id number] DOB: 11/26/1955 Today's Date: 01/29/2015    History of Present Illness This 60 y.o. female admitted for Lt direct anterior THA.  PMH includes:  OA Rt hip, DM   Clinical Impression   Patient evaluated by Occupational Therapy with no further acute OT needs identified. All education has been completed and the patient has no further questions. Pt is able to perform ADLs at min guard assist - mod A level.  She has all AE, spouse is very supportive.  All education completed.  See below for any follow-up Occupational Therapy or equipment needs. OT is signing off. Thank you for this referral.      Follow Up Recommendations  No OT follow up;Supervision/Assistance - 24 hour    Equipment Recommendations  None recommended by OT    Recommendations for Other Services       Precautions / Restrictions Precautions Precautions: None Restrictions Weight Bearing Restrictions: Yes LLE Weight Bearing: Weight bearing as tolerated      Mobility Bed Mobility Overal bed mobility: Needs Assistance Bed Mobility: Supine to Sit     Supine to sit: Min assist;HOB elevated     General bed mobility comments: Min A for Lt LE due to pain   Transfers Overall transfer level: Needs assistance Equipment used: Rolling walker (2 wheeled) Transfers: Sit to/from [employer] Sit to Stand: Min guard Stand pivot transfers: Min guard            Balance                                            ADL Overall ADL's : Needs assistance/impaired Eating/Feeding: Independent   Grooming: Wash/dry hands;Wash/dry face;Oral care;Brushing hair;Min guard;Standing   Upper Body Bathing: Set up;Sitting   Lower Body Bathing: Minimal assistance;Sit to/from stand   Upper Body Dressing : Set up;Sitting   Lower Body Dressing: Moderate assistance;Sit to/from stand Lower Body Dressing Details  (indicate cue type and reason): she has AE and is able to describe how to use it.  Spouse can also assist.  She is able to bend forward to mid calf to access feet.  Instructed her to attempt LB daily without use of AE or assist Toilet Transfer: Min guard;Ambulation;Comfort height toilet;Grab bars;RW   Toileting- Architect and Hygiene: Min guard;Sit to/from stand       Functional mobility during ADLs: Minimal assistance;Rolling walker General ADL Comments: Pt is very motivated.  Instructed she and spouse to remove area rugs and to ask MD when she can shower      Vision     Perception     Praxis      Pertinent Vitals/Pain Pain Assessment: 0-10 Pain Score: 5  Pain Location: Lt hip  Pain Descriptors / Indicators: Aching Pain Intervention(s): Monitored during session     Hand Dominance Right   Extremity/Trunk Assessment Upper Extremity Assessment Upper Extremity Assessment: Overall WFL for tasks assessed   Lower Extremity Assessment Lower Extremity Assessment: Defer to PT evaluation   Cervical / Trunk Assessment Cervical / Trunk Assessment: Normal   Communication Communication Communication: No difficulties   Cognition Arousal/Alertness: Awake/alert Behavior During Therapy: WFL for tasks assessed/performed Overall Cognitive Status: Within Functional Limits for tasks assessed  General Comments       Exercises       Shoulder Instructions      Home Living Family/patient expects to be discharged to:: Private residence Living Arrangements: Spouse/significant other;Children Available Help at Discharge: Family;Available 24 hours/day Type of Home: House Home Access: Stairs to enter [employer] of Steps: 4 Entrance Stairs-Rails: Right;Left Home Layout: Two level;1/2 bath on main level;Bed/bath upstairs Alternate Level Stairs-Number of Steps: 14   Bathroom Shower/Tub: Producer, television/film/video: Handicapped  height Bathroom Accessibility: Yes How Accessible: Accessible via walker Home Equipment: Walker - 4 wheels;Bedside commode;Shower seat;Grab bars - toilet;Adaptive equipment;Cane - single point Adaptive Equipment: Reacher;Sock aid;Long-handled shoe horn;Long-handled sponge Additional Comments: spouse is very supportive       Prior Functioning/Environment Level of Independence: Needs assistance  Gait / Transfers Assistance Needed: Pt ambulated mod I using two canes  ADL's / Homemaking Assistance Needed: Pt required assist for shoes and occasional socks.  She has AE        OT Diagnosis: Generalized weakness;Acute pain   OT Problem List:     OT Treatment/Interventions:      OT Goals(Current goals can be found in the care plan section) Acute Rehab OT Goals Patient Stated Goal: to regain independence  OT Goal Formulation: All assessment and education complete, DC therapy  OT Frequency:     Barriers to D/C:            Co-evaluation              End of Session Equipment Utilized During Treatment: Rolling walker Nurse Communication: Mobility status  Activity Tolerance: Patient tolerated treatment well Patient left: in chair;with call bell/phone within reach;with family/visitor present   Time: 1206-1234 OT Time Calculation (min): 28 min Charges:  OT General Charges $OT Visit: 1 Procedure OT Evaluation $OT Eval Low Complexity: 1 Procedure OT Treatments $Self Care/Home Management : 8-22 mins G-Codes:    Schuyler Olden M 02/16/2015, 12:44 PM

## 2015-01-29 NOTE — Care Management Note (Signed)
Case Management Note  Patient Details  Name: Debra Woods MRN: [id number] Date of Birth: 03/29/1955  Subjective/Objective:                  LEFT TOTAL HIP ARTHROPLASTY  Action/Plan: CM spoke with patient at the bedside. [facility] selected for HHPT. Dona at [facility] notified of the referral. Patient reports she has a rolling walker, a raised seat over her toilet, a shower chair, grabber and cane at home. Her husband will assist her at home.   Expected Discharge Date:      01/31/15            Expected Discharge Plan:  Home w Home Health Services  In-House Referral:     Discharge planning Services  CM Consult  Post Acute Care Choice:  Home Health Choice offered to:  Patient  DME Arranged:  N/A DME Agency:  NA  HH Arranged:  PT HH Agency:  [facility]  Status of Service:  In process, will continue to follow  Medicare Important Message Given:    Date Medicare IM Given:    Medicare IM give by:    Date Additional Medicare IM Given:    Additional Medicare Important Message give by:     If discussed at Long Length of Stay Meetings, dates discussed:    Additional Comments:  Antony Haste, RN 01/29/2015, 11:01 AM

## 2015-01-29 NOTE — Progress Notes (Signed)
Lab came this am to draw blood, patient was stuck once with needle and then requested lab to come back later and did not allow lab personnel to complete task.  Will pass information on to day shift to follow up.

## 2015-01-30 LAB — CBC
HEMATOCRIT: 33 % — AB (ref 36.0–46.0)
HEMOGLOBIN: 10.4 g/dL — AB (ref 12.0–15.0)
MCH: 26.8 pg (ref 26.0–34.0)
MCHC: 31.5 g/dL (ref 30.0–36.0)
MCV: 85.1 fL (ref 78.0–100.0)
Platelets: 371 10*3/uL (ref 150–400)
RBC: 3.88 MIL/uL (ref 3.87–5.11)
RDW: 13.7 % (ref 11.5–15.5)
WBC: 13.7 10*3/uL — AB (ref 4.0–10.5)

## 2015-01-30 LAB — BASIC METABOLIC PANEL
ANION GAP: 13 (ref 5–15)
BUN: 20 mg/dL (ref 6–20)
CALCIUM: 9.3 mg/dL (ref 8.9–10.3)
CHLORIDE: 104 mmol/L (ref 101–111)
CO2: 24 mmol/L (ref 22–32)
Creatinine, Ser: 1.1 mg/dL — ABNORMAL HIGH (ref 0.44–1.00)
GFR calc non Af Amer: 54 mL/min — ABNORMAL LOW (ref >60)
Glucose, Bld: 199 mg/dL — ABNORMAL HIGH (ref 65–99)
POTASSIUM: 4.1 mmol/L (ref 3.5–5.1)
Sodium: 141 mmol/L (ref 135–145)

## 2015-01-30 LAB — GLUCOSE, CAPILLARY
GLUCOSE-CAPILLARY: 137 mg/dL — AB (ref 65–99)
Glucose-Capillary: 130 mg/dL — ABNORMAL HIGH (ref 65–99)

## 2015-01-30 NOTE — Discharge Summary (Signed)
Physician Discharge Summary   Patient ID: Debra Woods MRN: [id number] DOB/AGE: [birth year] 58 y.o.  Admit date: 01/28/2015 Discharge date: 01/30/2015  Admission Diagnoses:  Principal Problem:   OA (osteoarthritis) of hip   Discharge Diagnoses:  Same   Surgeries: Procedure(s): LEFT TOTAL HIP ARTHROPLASTY ANTERIOR APPROACH on 01/28/2015   Consultants: PT  Discharged Condition: Stable  Hospital Course: Debra Woods is an 60 y.o. female who was admitted 01/28/2015 with a chief complaint of left hip pain, and found to have a diagnosis of OA (osteoarthritis) of hip.  They were brought to the operating room on 01/28/2015 and underwent the above named procedures.    The patient had an uncomplicated hospital course and was stable for discharge.  Recent vital signs:  Filed Vitals:   01/29/15 1341 01/29/15 2124  BP: 112/74 127/71  Pulse: 87 82  Temp: 98.1 F (36.7 C) 98.1 F (36.7 C)  Resp: 16 16    Recent laboratory studies:  Results for orders placed or performed during the hospital encounter of 01/28/15  Glucose, capillary  Result Value Ref Range   Glucose-Capillary 144 (H) 65 - 99 mg/dL  Glucose, capillary  Result Value Ref Range   Glucose-Capillary 126 (H) 65 - 99 mg/dL   Comment 1 Notify RN   CBC  Result Value Ref Range   WBC 13.4 (H) 4.0 - 10.5 K/uL   RBC 3.74 (L) 3.87 - 5.11 MIL/uL   Hemoglobin 10.1 (L) 12.0 - 15.0 g/dL   HCT 62.9 (L) 52.8 - 41.3 %   MCV 84.0 78.0 - 100.0 fL   MCH 27.0 26.0 - 34.0 pg   MCHC 32.2 30.0 - 36.0 g/dL   RDW 24.4 01.0 - 27.2 %   Platelets 314 150 - 400 K/uL  Basic metabolic panel  Result Value Ref Range   Sodium 138 135 - 145 mmol/L   Potassium 4.8 3.5 - 5.1 mmol/L   Chloride 104 101 - 111 mmol/L   CO2 24 22 - 32 mmol/L   Glucose, Bld 212 (H) 65 - 99 mg/dL   BUN 15 6 - 20 mg/dL   Creatinine, Ser 5.36 (H) 0.44 - 1.00 mg/dL   Calcium 8.9 8.9 - 64.4 mg/dL   GFR calc non Af Amer 56 (L) >60 mL/min   GFR calc Af Amer >60 >60  mL/min   Anion gap 10 5 - 15  Glucose, capillary  Result Value Ref Range   Glucose-Capillary 184 (H) 65 - 99 mg/dL  Glucose, capillary  Result Value Ref Range   Glucose-Capillary 242 (H) 65 - 99 mg/dL  Glucose, capillary  Result Value Ref Range   Glucose-Capillary 260 (H) 65 - 99 mg/dL  Glucose, capillary  Result Value Ref Range   Glucose-Capillary 175 (H) 65 - 99 mg/dL  Glucose, capillary  Result Value Ref Range   Glucose-Capillary 186 (H) 65 - 99 mg/dL  Glucose, capillary  Result Value Ref Range   Glucose-Capillary 228 (H) 65 - 99 mg/dL   Comment 1 Notify RN   Glucose, capillary  Result Value Ref Range   Glucose-Capillary 152 (H) 65 - 99 mg/dL  Glucose, capillary  Result Value Ref Range   Glucose-Capillary 130 (H) 65 - 99 mg/dL    Discharge Medications:     Medication List    STOP taking these medications        diclofenac 75 MG EC tablet  Commonly known as:  VOLTAREN      TAKE these medications  atorvastatin 20 MG tablet  Commonly known as:  LIPITOR  Take 20 mg by mouth daily.     buPROPion 300 MG 24 hr tablet  Commonly known as:  WELLBUTRIN XL  Take 300 mg by mouth every morning. Reported on 01/14/2015     lisinopril-hydrochlorothiazide 10-12.5 MG tablet  Commonly known as:  PRINZIDE,ZESTORETIC  Take 1 tablet by mouth daily.     metFORMIN 850 MG tablet  Commonly known as:  GLUCOPHAGE  Take 850 mg by mouth 2 (two) times daily.     methocarbamol 500 MG tablet  Commonly known as:  ROBAXIN  Take 1 tablet (500 mg total) by mouth every 6 (six) hours as needed for muscle spasms.     oxyCODONE 5 MG immediate release tablet  Commonly known as:  Oxy IR/ROXICODONE  Take 1-2 tablets (5-10 mg total) by mouth every 3 (three) hours as needed for breakthrough pain.     pantoprazole 40 MG tablet  Commonly known as:  PROTONIX  Take 40 mg by mouth daily.     rivaroxaban 10 MG Tabs tablet  Commonly known as:  XARELTO  Take 1 tablet (10 mg total) by mouth  daily with breakfast.     zolpidem 10 MG tablet  Commonly known as:  AMBIEN  Take 5 mg by mouth at bedtime as needed.        Diagnostic Studies: Dg Pelvis Portable  01/28/2015  CLINICAL DATA:  Status post left hip replacement. EXAM: PORTABLE PELVIS 1-2 VIEWS COMPARISON:  None. FINDINGS: The femoral and acetabular components appear to be well situated. Surgical drain is seen in the adjacent soft tissues. No dislocation is noted. 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 IMPRESSION: Status post left total hip arthroplasty. Electronically Signed   By: Lupita Raider, M.D.   On: 01/28/2015 14:33   Dg Hip Operative Unilat With Pelvis Left  01/28/2015  CLINICAL DATA:  Status post left total hip arthroplasty. EXAM: OPERATIVE left HIP (WITH PELVIS IF PERFORMED) 2 VIEWS TECHNIQUE: Fluoroscopic spot image(s) were submitted for interpretation post-operatively. FLUOROSCOPY TIME:  8 seconds. COMPARISON:  None. FINDINGS: The femoral and acetabular components appear to be well situated. No dislocation is noted. IMPRESSION: Status post left total hip arthroplasty. Electronically Signed   By: Lupita Raider, M.D.   On: 01/28/2015 13:25    Disposition: home with home health therapy        Follow-up Information    Follow up with Loanne Drilling, MD. Schedule an appointment as soon as possible for a visit on 02/10/2015.   Specialty:  Orthopedic Surgery   Why:  Call [phone] Monday to make the appointment   Contact information:   [address] [place] [address] [phone]       Follow up with [facility].   Why:  home health physical therapy   Contact information:   [address] [place] [address] [phone]        Signed: Verlee Rossetti 01/30/2015, 9:18 AM

## 2015-01-30 NOTE — Progress Notes (Signed)
Physical Therapy Treatment Patient Details Name: Debra Woods MRN: [id number] DOB: 11/12/1955 Today's Date: 01/30/2015    History of Present Illness This 60 y.o. female admitted for Lt direct anterior THA.  PMH includes:  OA Rt hip, DM    PT Comments    Patient doing well with mobility and gait.  Able to negotiate stairs with assist of husband.  Patient has achieved PT goals and is ready for d/c from PT perspective.  To have f/u HHPT.  Follow Up Recommendations  Home health PT;Supervision for mobility/OOB     Equipment Recommendations  None recommended by PT    Recommendations for Other Services       Precautions / Restrictions Precautions Precautions: None Restrictions Weight Bearing Restrictions: Yes LLE Weight Bearing: Weight bearing as tolerated    Mobility  Bed Mobility               General bed mobility comments: OOB  Transfers Overall transfer level: Needs assistance Equipment used: 4-wheeled walker Transfers: Sit to/from Stand Sit to Stand: Supervision         General transfer comment: Verbal cues for hand placement and technique with rollator.  Ambulation/Gait Ambulation/Gait assistance: Supervision Ambulation Distance (Feet): 150 Feet Assistive device: 4-wheeled walker Gait Pattern/deviations: Step-through pattern;Decreased step length - right;Decreased stance time - left Gait velocity: decreased Gait velocity interpretation: Below normal speed for age/gender General Gait Details: Verbal cues for safe use of rollator.  Good gait pattern and balance.   Stairs Stairs: Yes Stairs assistance: Min assist Stair Management: No rails;One rail Left;Step to pattern;Forwards;With cane Number of Stairs: 6 General stair comments: Instructed patient and husband on negotiating stairs with step-to pattern with cane and 1 rail (inside home) and cane and hand hold (to get into home).  Husband able to assist patient.  Patient requiring min assist for  balance and safety.  Wheelchair Mobility    Modified Rankin (Stroke Patients Only)       Balance                                    Cognition Arousal/Alertness: Awake/alert Behavior During Therapy: WFL for tasks assessed/performed Overall Cognitive Status: Within Functional Limits for tasks assessed                      Exercises Total Joint Exercises Ankle Circles/Pumps: AROM;Both;10 reps;Seated Quad Sets: AROM;Both;10 reps;Seated Heel Slides: AROM;Left;5 reps;Seated    General Comments        Pertinent Vitals/Pain Pain Assessment: 0-10 Pain Score: 3  Pain Location: Lt hip Pain Descriptors / Indicators: Sore Pain Intervention(s): Monitored during session;Repositioned    Home Living                      Prior Function            PT Goals (current goals can now be found in the care plan section) Progress towards PT goals: Goals met/education completed, patient discharged from PT    Frequency  7X/week    PT Plan Current plan remains appropriate    Co-evaluation             End of Session Equipment Utilized During Treatment: Gait belt Activity Tolerance: Patient tolerated treatment well Patient left: in chair;with call bell/phone within reach;with family/visitor present     Time: 2202-5427 PT Time Calculation (min) (ACUTE ONLY): 28 min  Charges:  $  Gait Training: 23-37 mins                    G Codes:      Despina Pole 02/09/15, 11:21 AM Carita Pian. Sanjuana Kava, [facility] Pager [phone]

## 2015-01-30 NOTE — Progress Notes (Signed)
Orthopedics Progress Note  Subjective: Patient feeling better today. Ready to go home  Objective:  Filed Vitals:   01/29/15 1341 01/29/15 2124  BP: 112/74 127/71  Pulse: 87 82  Temp: 98.1 F (36.7 C) 98.1 F (36.7 C)  Resp: 16 16    General: Awake and alert  Musculoskeletal: anterior hip incision clean and dry and intact Neurovascularly intact  Lab Results  Component Value Date   WBC 13.4* 01/29/2015   HGB 10.1* 01/29/2015   HCT 31.4* 01/29/2015   MCV 84.0 01/29/2015   PLT 314 01/29/2015       Component Value Date/Time   NA 138 01/29/2015 1145   K 4.8 01/29/2015 1145   CL 104 01/29/2015 1145   CO2 24 01/29/2015 1145   GLUCOSE 212* 01/29/2015 1145   BUN 15 01/29/2015 1145   CREATININE 1.07* 01/29/2015 1145   CALCIUM 8.9 01/29/2015 1145   GFRNONAA 56* 01/29/2015 1145   GFRAA >60 01/29/2015 1145    Lab Results  Component Value Date   INR 1.01 01/17/2015    Assessment/Plan: POD #2 s/p Procedure(s): LEFT TOTAL HIP ARTHROPLASTY ANTERIOR APPROACH Discharge to home after therapy today  Viviann Spare R. Ranell Patrick, MD 01/30/2015 9:16 AM

## 2015-01-31 ENCOUNTER — Encounter (HOSPITAL_COMMUNITY): Payer: Self-pay | Admitting: Orthopedic Surgery

## 2015-06-12 ENCOUNTER — Ambulatory Visit: Payer: Self-pay | Admitting: Orthopedic Surgery

## 2015-06-26 ENCOUNTER — Other Ambulatory Visit: Payer: Self-pay

## 2015-06-26 ENCOUNTER — Emergency Department (HOSPITAL_COMMUNITY)
Admission: EM | Admit: 2015-06-26 | Discharge: 2015-06-26 | Disposition: A | Discharge status: Home / Self Care | Payer: BC Managed Care – PPO | Attending: Emergency Medicine | Admitting: Emergency Medicine

## 2015-06-26 ENCOUNTER — Emergency Department (HOSPITAL_COMMUNITY): Payer: BC Managed Care – PPO

## 2015-06-26 ENCOUNTER — Encounter (HOSPITAL_COMMUNITY): Payer: Self-pay

## 2015-06-26 DIAGNOSIS — R1013 Epigastric pain: Secondary | ICD-10-CM | POA: Insufficient documentation

## 2015-06-26 DIAGNOSIS — I10 Essential (primary) hypertension: Secondary | ICD-10-CM | POA: Diagnosis not present

## 2015-06-26 DIAGNOSIS — Z96642 Presence of left artificial hip joint: Secondary | ICD-10-CM | POA: Insufficient documentation

## 2015-06-26 DIAGNOSIS — Z79899 Other long term (current) drug therapy: Secondary | ICD-10-CM | POA: Diagnosis not present

## 2015-06-26 DIAGNOSIS — E119 Type 2 diabetes mellitus without complications: Secondary | ICD-10-CM | POA: Insufficient documentation

## 2015-06-26 DIAGNOSIS — Z7984 Long term (current) use of oral hypoglycemic drugs: Secondary | ICD-10-CM | POA: Insufficient documentation

## 2015-06-26 DIAGNOSIS — F17210 Nicotine dependence, cigarettes, uncomplicated: Secondary | ICD-10-CM | POA: Diagnosis not present

## 2015-06-26 LAB — BASIC METABOLIC PANEL
Anion gap: 9 (ref 5–15)
BUN: 21 mg/dL — ABNORMAL HIGH (ref 6–20)
CO2: 28 mmol/L (ref 22–32)
Calcium: 10.3 mg/dL (ref 8.9–10.3)
Chloride: 101 mmol/L (ref 101–111)
Creatinine, Ser: 1.46 mg/dL — ABNORMAL HIGH (ref 0.44–1.00)
GFR calc Af Amer: 45 mL/min — ABNORMAL LOW (ref >60)
GFR calc non Af Amer: 39 mL/min — ABNORMAL LOW (ref >60)
Glucose, Bld: 191 mg/dL — ABNORMAL HIGH (ref 65–99)
Potassium: 4.5 mmol/L (ref 3.5–5.1)
Sodium: 138 mmol/L (ref 135–145)

## 2015-06-26 LAB — LIPASE, BLOOD: Lipase: 27 U/L (ref 11–51)

## 2015-06-26 LAB — CBC
HCT: 40 % (ref 36.0–46.0)
Hemoglobin: 12.8 g/dL (ref 12.0–15.0)
MCH: 26.4 pg (ref 26.0–34.0)
MCHC: 32 g/dL (ref 30.0–36.0)
MCV: 82.5 fL (ref 78.0–100.0)
Platelets: 437 10*3/uL — ABNORMAL HIGH (ref 150–400)
RBC: 4.85 MIL/uL (ref 3.87–5.11)
RDW: 14.5 % (ref 11.5–15.5)
WBC: 14.8 10*3/uL — ABNORMAL HIGH (ref 4.0–10.5)

## 2015-06-26 LAB — I-STAT TROPONIN, ED: Troponin i, poc: 0 ng/mL (ref 0.00–0.08)

## 2015-06-26 MED ORDER — ONDANSETRON 4 MG PO TBDP
4.0000 mg | ORAL_TABLET | Freq: Once | ORAL | Status: AC
  Administered 2015-06-26: 4 mg via ORAL
  Filled 2015-06-26: qty 1

## 2015-06-26 MED ORDER — LORAZEPAM 0.5 MG PO TABS: 0.5000 mg | ORAL_TABLET | ORAL | Status: DC | PRN

## 2015-06-26 MED ORDER — ONDANSETRON HCL 4 MG PO TABS: 4.0000 mg | ORAL_TABLET | Freq: Four times a day (QID) | ORAL | Status: DC

## 2015-06-26 MED ORDER — LORAZEPAM 1 MG PO TABS
0.5000 mg | ORAL_TABLET | Freq: Once | ORAL | Status: AC
  Administered 2015-06-26: 0.5 mg via ORAL
  Filled 2015-06-26: qty 1

## 2015-06-26 NOTE — ED Notes (Signed)
Patient here from [facility] for further evaluation of epigastric/chest pain since am. No pain or discomfort on arrival.

## 2015-06-26 NOTE — Discharge Instructions (Signed)
Nonspecific Chest Pain  °Chest pain can be caused by many different conditions. There is always a chance that your pain could be related to something serious, such as a heart attack or a blood clot in your lungs. Chest pain can also be caused by conditions that are not life-threatening. If you have chest pain, it is very important to follow up with your health care provider. °CAUSES  °Chest pain can be caused by: °· Heartburn. °· Pneumonia or bronchitis. °· Anxiety or stress. °· Inflammation around your heart (pericarditis) or lung (pleuritis or pleurisy). °· A blood clot in your lung. °· A collapsed lung (pneumothorax). It can develop suddenly on its own (spontaneous pneumothorax) or from trauma to the chest. °· Shingles infection (varicella-zoster virus). °· Heart attack. °· Damage to the bones, muscles, and cartilage that make up your chest wall. This can include: °¨ Bruised bones due to injury. °¨ Strained muscles or cartilage due to frequent or repeated coughing or overwork. °¨ Fracture to one or more ribs. °¨ Sore cartilage due to inflammation (costochondritis). °RISK FACTORS  °Risk factors for chest pain may include: °· Activities that increase your risk for trauma or injury to your chest. °· Respiratory infections or conditions that cause frequent coughing. °· Medical conditions or overeating that can cause heartburn. °· Heart disease or family history of heart disease. °· Conditions or health behaviors that increase your risk of developing a blood clot. °· Having had chicken pox (varicella zoster). °SIGNS AND SYMPTOMS °Chest pain can feel like: °· Burning or tingling on the surface of your chest or deep in your chest. °· Crushing, pressure, aching, or squeezing pain. °· Dull or sharp pain that is worse when you move, cough, or take a deep breath. °· Pain that is also felt in your back, neck, shoulder, or arm, or pain that spreads to any of these areas. °Your chest pain may come and go, or it may stay  constant. °DIAGNOSIS °Lab tests or other studies may be needed to find the cause of your pain. Your health care provider may have you take a test called an ambulatory ECG (electrocardiogram). An ECG records your heartbeat patterns at the time the test is performed. You may also have other tests, such as: °· Transthoracic echocardiogram (TTE). During echocardiography, sound waves are used to create a picture of all of the heart structures and to look at how blood flows through your heart. °· Transesophageal echocardiogram (TEE). This is a more advanced imaging test that obtains images from inside your body. It allows your health care provider to see your heart in finer detail. °· Cardiac monitoring. This allows your health care provider to monitor your heart rate and rhythm in real time. °· Holter monitor. This is a portable device that records your heartbeat and can help to diagnose abnormal heartbeats. It allows your health care provider to track your heart activity for several days, if needed. °· Stress tests. These can be done through exercise or by taking medicine that makes your heart beat more quickly. °· Blood tests. °· Imaging tests. °TREATMENT  °Your treatment depends on what is causing your chest pain. Treatment may include: °· Medicines. These may include: °¨ Acid blockers for heartburn. °¨ Anti-inflammatory medicine. °¨ Pain medicine for inflammatory conditions. °¨ Antibiotic medicine, if an infection is present. °¨ Medicines to dissolve blood clots. °¨ Medicines to treat coronary artery disease. °· Supportive care for conditions that do not require medicines. This may include: °¨ Resting. °¨ Applying heat   or cold packs to injured areas. °¨ Limiting activities until pain decreases. °HOME CARE INSTRUCTIONS °· If you were prescribed an antibiotic medicine, finish it all even if you start to feel better. °· Avoid any activities that bring on chest pain. °· Do not use any tobacco products, including  cigarettes, chewing tobacco, or electronic cigarettes. If you need help quitting, ask your health care provider. °· Do not drink alcohol. °· Take medicines only as directed by your health care provider. °· Keep all follow-up visits as directed by your health care provider. This is important. This includes any further testing if your chest pain does not go away. °· If heartburn is the cause for your chest pain, you may be told to keep your head raised (elevated) while sleeping. This reduces the chance that acid will go from your stomach into your esophagus. °· Make lifestyle changes as directed by your health care provider. These may include: °¨ Getting regular exercise. Ask your health care provider to suggest some activities that are safe for you. °¨ Eating a heart-healthy diet. A registered dietitian can help you to learn healthy eating options. °¨ Maintaining a healthy weight. °¨ Managing diabetes, if necessary. °¨ Reducing stress. °SEEK MEDICAL CARE IF: °· Your chest pain does not go away after treatment. °· You have a rash with blisters on your chest. °· You have a fever. °SEEK IMMEDIATE MEDICAL CARE IF:  °· Your chest pain is worse. °· You have an increasing cough, or you cough up blood. °· You have severe abdominal pain. °· You have severe weakness. °· You faint. °· You have chills. °· You have sudden, unexplained chest discomfort. °· You have sudden, unexplained discomfort in your arms, back, neck, or jaw. °· You have shortness of breath at any time. °· You suddenly start to sweat, or your skin gets clammy. °· You feel nauseous or you vomit. °· You suddenly feel light-headed or dizzy. °· Your heart begins to beat quickly, or it feels like it is skipping beats. °These symptoms may represent a serious problem that is an emergency. Do not wait to see if the symptoms will go away. Get medical help right away. Call your local emergency services (911 in the [place] Do not drive yourself to the hospital. °  °This  information is not intended to replace advice given to you by your health care provider. Make sure you discuss any questions you have with your health care provider. °  °Document Released: 09/27/2004 Document Revised: 01/08/2014 Document Reviewed: 07/24/2013 °Elsevier Interactive Patient Education ©2016 [employer] ° °

## 2015-06-26 NOTE — ED Provider Notes (Signed)
CSN: [id number]     Arrival date & time 06/26/15  1728 History   First MD Initiated Contact with Patient 06/26/15 2015     Chief Complaint  Patient presents with  . epigastric pain      (Consider location/radiation/quality/duration/timing/severity/associated sxs/prior Treatment) HPI  Patient has PMH of GERD, hypertension, depression, hyperlipidemia, diabetes, arthritis presents To the ER from the [facility] walk[facility] for further evaluation of epigastric and chest pain for the past week. 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 She has not noticed anything that illicits or alleviates the pain. It is epigastric an sometimes felt in her back. She sometimes feels tingling into her neck or arm. She also experiences shallow breath, crying spells, acid reflux symptoms and lack of appetite.   The patient says that she doesn't want to talk about the stress in her life and becomes tearful. She is not currently having any pain. She has not taken any medication for the pain. She has never seen a cardiologist or had a stress test done.  Past Medical History  Diagnosis Date  . GERD (gastroesophageal reflux disease)   . Hypertension   . Depression   . Complication of anesthesia     cpr was done, pt quit breathing 06/04/01 code note states: patient on Morphine PCA and IV toradol post hysterectomy, found apneic and responded to Narcan  . Hyperlipidemia   . Type II diabetes mellitus (HCC)   . Arthritis     "hips" (01/28/2015)   Past Surgical History  Procedure Laterality Date  . Joint replacement    . Total hip arthroplasty Left 01/28/2015    anterior approach  . Tubal ligation    . Abdominal hysterectomy  2003    "partial"  . Total hip arthroplasty Left 01/28/2015    Procedure: LEFT TOTAL HIP ARTHROPLASTY ANTERIOR  APPROACH;  Surgeon: Ollen GrossFrank [name], MD;  Location: [facility] OR;  Service: Orthopedics;  Laterality: Left;   No family history on file. Social History  Substance Use Topics  . Smoking status: Current Every Day Smoker -- 0.10 packs/day for 1 years    Types: Cigarettes  . Smokeless tobacco: None  . Alcohol Use: 1.2 oz/week    2 Glasses of wine per week   OB History    No data available     Review of Systems  Review of Systems All other systems negative except as documented in the HPI. All pertinent positives and negatives as reviewed in the HPI.   Allergies  Other  Home Medications   Prior to Admission medications   Medication Sig Start Date End Date Taking? Authorizing Provider  atorvastatin (LIPITOR) 20 MG tablet Take 20 mg by mouth daily. 11/26/14   Historical Provider, MD  buPROPion (WELLBUTRIN XL) 300 MG 24 hr tablet Take 300 mg by mouth every morning. Reported on 01/14/2015 12/20/14   Historical Provider, MD  lisinopril-hydrochlorothiazide (PRINZIDE,ZESTORETIC) 10-12.5 MG tablet Take 1 tablet by mouth daily. 12/29/14   Historical Provider, MD  LORazepam (ATIVAN) 0.5 MG tablet Take 1 tablet (0.5 mg total) by mouth every 4 (four) hours as needed for anxiety. 06/26/15   Christen Bedoya Neva SeatGreene, PA-C  metFORMIN (GLUCOPHAGE) 850 MG tablet Take 850 mg by mouth 2 (two) times daily. 01/04/15   Historical Provider, MD  methocarbamol (ROBAXIN) 500 MG tablet Take 1 tablet (500 mg total)  by mouth every 6 (six) hours as needed for muscle spasms. 01/29/15   Ollen Gross, MD  ondansetron (ZOFRAN) 4 MG tablet Take 1 tablet (4 mg total) by mouth every 6 (six) hours. 06/26/15   Marlon Pel, PA-C  oxyCODONE (OXY IR/ROXICODONE) 5 MG immediate release tablet Take 1-2 tablets (5-10 mg total) by mouth every 3 (three) hours as needed for breakthrough pain. 01/29/15   Ollen Gross, MD  pantoprazole (PROTONIX) 40 MG tablet Take 40 mg by mouth daily. 12/14/14   Historical Provider, MD  rivaroxaban (XARELTO) 10 MG TABS  tablet Take 1 tablet (10 mg total) by mouth daily with breakfast. 01/29/15   Ollen Gross, MD  zolpidem (AMBIEN) 10 MG tablet Take 5 mg by mouth at bedtime as needed. 12/30/14   Historical Provider, MD   BP 160/78 mmHg  Pulse 91  Temp(Src) 97.7 F (36.5 C) (Oral)  Resp 18  Ht  (1.575 m)  Wt 71.668 kg  BMI 28.89 kg/m2  SpO2 100% Physical Exam  Constitutional: She appears well-developed and well-nourished. No distress.  HENT:  Head: Normocephalic and atraumatic.  Right Ear: Tympanic membrane and ear canal normal.  Left Ear: Tympanic membrane and ear canal normal.  Nose: Nose normal.  Mouth/Throat: Uvula is midline, oropharynx is clear and moist and mucous membranes are normal.  Eyes: Pupils are equal, round, and reactive to light.  Neck: Normal range of motion. Neck supple.  Cardiovascular: Normal rate and regular rhythm.   Pulmonary/Chest: Effort normal and breath sounds normal. She exhibits no tenderness and no crepitus.  Abdominal: Soft. Bowel sounds are normal. She exhibits no distension. There is no tenderness. There is no rigidity, no rebound, no guarding, no CVA tenderness, no tenderness at McBurney's point and negative Murphy's sign.  No signs of abdominal distention  Musculoskeletal:  No LE swelling  Neurological: She is alert.  Acting at baseline  Skin: Skin is warm and dry. No rash noted.  Nursing note and vitals reviewed.   ED Course  Procedures (including critical care time) Labs Review Labs Reviewed  BASIC METABOLIC PANEL - Abnormal; Notable for the following:    Glucose, Bld 191 (*)    BUN 21 (*)    Creatinine, Ser 1.46 (*)    GFR calc non Af Amer 39 (*)    GFR calc Af Amer 45 (*)    All other components within normal limits  CBC - Abnormal; Notable for the following:    WBC 14.8 (*)    Platelets 437 (*)    All other components within normal limits  LIPASE, BLOOD  I-STAT TROPOININ, ED    Imaging Review Dg Chest 2 View  06/26/2015  CLINICAL  DATA:  60 year old female with double mid sternal chest pain EXAM: CHEST  2 VIEW COMPARISON:  None. FINDINGS: The heart size and mediastinal contours are within normal limits. Both lungs are clear. The visualized skeletal structures are unremarkable. IMPRESSION: No active cardiopulmonary disease. Electronically Signed   By: Elgie Collard M.D.   On: 06/26/2015 18:34   I have personally reviewed and evaluated these images and lab results as part of my medical decision-making.   EKG Interpretation None      MDM   Final diagnoses:  Epigastric pain    Patient is to be discharged with recommendation to follow up with PCP in regards to today's hospital visit.   The patient has a HEART SCORE of 4 and was offered admission. She understands her moderate risk and that without  further testing it cannot be definitively determined if the symptoms she is having are cardiac. She understands and wants to follow-up outpatient. She declines having a second troponin done as she does not like needles and refused the IV.  Chest pain is not likely of c pulmonary etiology d/t presentation, perc negative, VSS, no tracheal deviation, no JVD or new murmur, RRR, breath sounds equal bilaterally, EKG without acute abnormalities, negative troponin, and negative CXR. Pt has been advised start a PPI and return to the ED is CP becomes exertional, associated with diaphoresis or nausea, radiates to left jaw/arm, worsens or becomes concerning in any way. Pt appears reliable for follow up and is agreeable to discharge.   Case has been discussed with Dr. Juleen ChinaKohut who agrees with the above plan to discharge.      Marlon Peliffany Annaliese Saez, PA-C 06/26/15 2152  Raeford RazorStephen Kohut, MD 06/28/15 [phone]

## 2015-07-13 ENCOUNTER — Encounter (HOSPITAL_COMMUNITY): Payer: Self-pay

## 2015-07-13 ENCOUNTER — Encounter (HOSPITAL_COMMUNITY)
Admission: RE | Admit: 2015-07-13 | Discharge: 2015-07-13 | Disposition: A | Discharge status: Home / Self Care | Payer: BC Managed Care – PPO | Source: Ambulatory Visit | Attending: Orthopedic Surgery | Admitting: Orthopedic Surgery

## 2015-07-13 DIAGNOSIS — Z01812 Encounter for preprocedural laboratory examination: Secondary | ICD-10-CM | POA: Diagnosis present

## 2015-07-13 HISTORY — DX: Unspecified urinary incontinence: R32

## 2015-07-13 HISTORY — DX: Cardiac murmur, unspecified: R01.1

## 2015-07-13 HISTORY — DX: Presence of dental prosthetic device (complete) (partial): Z97.2

## 2015-07-13 HISTORY — DX: Effusion, right ankle: M25.471

## 2015-07-13 HISTORY — DX: Effusion, left ankle: M25.472

## 2015-07-13 HISTORY — DX: Dry eye syndrome of bilateral lacrimal glands: H04.123

## 2015-07-13 HISTORY — DX: Presence of spectacles and contact lenses: Z97.3

## 2015-07-13 LAB — CBC
HEMATOCRIT: 38 % (ref 36.0–46.0)
Hemoglobin: 12 g/dL (ref 12.0–15.0)
MCH: 26.4 pg (ref 26.0–34.0)
MCHC: 31.6 g/dL (ref 30.0–36.0)
MCV: 83.5 fL (ref 78.0–100.0)
Platelets: 373 10*3/uL (ref 150–400)
RBC: 4.55 MIL/uL (ref 3.87–5.11)
RDW: 14.7 % (ref 11.5–15.5)
WBC: 12.1 10*3/uL — AB (ref 4.0–10.5)

## 2015-07-13 LAB — COMPREHENSIVE METABOLIC PANEL
ALT: 17 U/L (ref 14–54)
AST: 19 U/L (ref 15–41)
Albumin: 4.2 g/dL (ref 3.5–5.0)
Alkaline Phosphatase: 93 U/L (ref 38–126)
Anion gap: 8 (ref 5–15)
BILIRUBIN TOTAL: 0.8 mg/dL (ref 0.3–1.2)
BUN: 16 mg/dL (ref 6–20)
CO2: 27 mmol/L (ref 22–32)
CREATININE: 0.88 mg/dL (ref 0.44–1.00)
Calcium: 9.5 mg/dL (ref 8.9–10.3)
Chloride: 100 mmol/L — ABNORMAL LOW (ref 101–111)
GFR calc Af Amer: >60 mL/min (ref >60)
Glucose, Bld: 189 mg/dL — ABNORMAL HIGH (ref 65–99)
POTASSIUM: 4.7 mmol/L (ref 3.5–5.1)
Sodium: 135 mmol/L (ref 135–145)
TOTAL PROTEIN: 7 g/dL (ref 6.5–8.1)

## 2015-07-13 LAB — URINALYSIS, ROUTINE W REFLEX MICROSCOPIC
BILIRUBIN URINE: NEGATIVE
GLUCOSE, UA: NEGATIVE mg/dL
HGB URINE DIPSTICK: NEGATIVE
Ketones, ur: NEGATIVE mg/dL
Leukocytes, UA: NEGATIVE
NITRITE: NEGATIVE
Protein, ur: NEGATIVE mg/dL
SPECIFIC GRAVITY, URINE: 1.006 (ref 1.005–1.030)
pH: 5.5 (ref 5.0–8.0)

## 2015-07-13 LAB — SURGICAL PCR SCREEN
MRSA, PCR: NEGATIVE
STAPHYLOCOCCUS AUREUS: NEGATIVE

## 2015-07-13 LAB — PROTIME-INR
INR: 0.99 (ref 0.00–1.49)
PROTHROMBIN TIME: 12.9 s (ref 11.6–15.2)

## 2015-07-13 LAB — APTT: aPTT: 27 seconds (ref 24–37)

## 2015-07-13 LAB — ABO/RH: ABO/RH(D): O POS

## 2015-07-13 NOTE — Patient Instructions (Signed)
Debra Woods  07/13/2015   Your procedure is scheduled on: Wednesday July 20, 2015  [facility]  Entrance [place]  elevators to 3rd floor to  Short Stay Center at 10:00 AM.  Call this number if you have problems the morning of surgery [phone]   Remember: ONLY 1 PERSON MAY GO WITH YOU TO SHORT STAY TO GET  READY MORNING OF YOUR SURGERY.  Do not eat food After Midnight but may take clear liquid diet till 7:00 am day of surgery then nothing by mouth.      Take these medicines the morning of surgery with A SIP OF WATER: Bupropion (Wellbutrin); Pantoprazole (Protonix)  DO NOT TAKE ANY DIABETIC MEDICATIONS DAY OF YOUR SURGERY                               You may not have any metal on your body including hair pins and              piercings  Do not wear jewelry, make-up, lotions, powders or perfumes, deodorant             Do not wear nail polish.  Do not shave  48 hours prior to surgery.                Do not bring valuables to the hospital. [facility] IS NOT             RESPONSIBLE   FOR VALUABLES.  Contacts, dentures or bridgework may not be worn into surgery.  Leave suitcase in the car. After surgery it may be brought to your room.              Please read over the following fact sheets you were given:MRSA INFORMATION SHEET; INCENTIVE SPIROMETER; BLOOD TRANSFUSION INFORMATION SHEET  _____________________________________________________________________             [facility] - Preparing for [facility] Before surgery, you can play an important role.  Because skin is not sterile, your skin needs to be as free of germs as possible.  You can reduce the number of germs on your skin by washing with CHG (chlorahexidine gluconate) soap before surgery.  CHG is an antiseptic cleaner which kills germs and bonds with the skin to continue killing germs even after washing. Please DO NOT use if you have an allergy to CHG or antibacterial soaps.  If your  skin becomes reddened/irritated stop using the CHG and inform your nurse when you arrive at Short Stay. Do not shave (including legs and underarms) for at least 48 hours prior to the first CHG shower.  You may shave your face/neck. Please follow these instructions carefully:  1.  Shower with CHG Soap the night before surgery and the  morning of Surgery.  2.  If you choose to wash your hair, wash your hair first as usual with your  normal  shampoo.  3.  After you shampoo, rinse your hair and body thoroughly to remove the  shampoo.                           4.  Use CHG as you would any other liquid soap.  You can apply chg directly  to the skin and wash  Gently with a scrungie or clean washcloth.  5.  Apply the CHG Soap to your body ONLY FROM THE NECK DOWN.   Do not use on face/ open                           Wound or open sores. Avoid contact with eyes, ears mouth and genitals (private parts).                       Wash face,  Genitals (private parts) with your normal soap.             6.  Wash thoroughly, paying special attention to the area where your surgery  will be performed.  7.  Thoroughly rinse your body with warm water from the neck down.  8.  DO NOT shower/wash with your normal soap after using and rinsing off  the CHG Soap.                9.  Pat yourself dry with a clean towel.            10.  Wear clean pajamas.            11.  Place clean sheets on your bed the night of your first shower and do not  sleep with pets. Day of Surgery : Do not apply any lotions/deodorants the morning of surgery.  Please wear clean clothes to the hospital/surgery center.  FAILURE TO FOLLOW THESE INSTRUCTIONS MAY RESULT IN THE CANCELLATION OF YOUR SURGERY PATIENT SIGNATURE_________________________________  NURSE SIGNATURE__________________________________  ________________________________________________________________________    CLEAR LIQUID DIET   Foods Allowed                                                                      Foods Excluded  Coffee and tea, regular and decaf                             liquids that you cannot  Plain Jell-O in any flavor                                             see through such as: Fruit ices (not with fruit pulp)                                     milk, soups, orange juice  Iced Popsicles                                    All solid food Carbonated beverages, regular and diet                                    Cranberry, grape and apple juices Sports drinks like Gatorade Lightly seasoned clear broth or consume(fat free) Sugar, honey syrup  Sample Menu Breakfast                                Lunch                                     Supper Cranberry juice                    Beef broth                            Chicken broth Jell-O                                     Grape juice                           Apple juice Coffee or tea                        Jell-O                                      Popsicle                                                Coffee or tea                        Coffee or tea  _____________________________________________________________________    Incentive Spirometer  An incentive spirometer is a tool that can help keep your lungs clear and active. This tool measures how well you are filling your lungs with each breath. Taking long deep breaths may help reverse or decrease the chance of developing breathing (pulmonary) problems (especially infection) following:  A long period of time when you are unable to move or be active. BEFORE THE PROCEDURE   If the spirometer includes an indicator to show your best effort, your nurse or respiratory therapist will set it to a desired goal.  If possible, sit up straight or lean slightly forward. Try not to slouch.  Hold the incentive spirometer in an upright position. INSTRUCTIONS FOR USE   Sit on the edge of your bed if possible, or sit up as  far as you can in bed or on a chair.  Hold the incentive spirometer in an upright position.  Breathe out normally.  Place the mouthpiece in your mouth and seal your lips tightly around it.  Breathe in slowly and as deeply as possible, raising the piston or the ball toward the top of the column.  Hold your breath for 3-5 seconds or for as long as possible. Allow the piston or ball to fall to the bottom of the column.  Remove the mouthpiece from your mouth and breathe out normally.  Rest for a few seconds and repeat Steps 1 through 7 at least 10 times every 1-2 hours when you are awake. Take your time and take a few normal breaths between  deep breaths.  The spirometer may include an indicator to show your best effort. Use the indicator as a goal to work toward during each repetition.  After each set of 10 deep breaths, practice coughing to be sure your lungs are clear. If you have an incision (the cut made at the time of surgery), support your incision when coughing by placing a pillow or rolled up towels firmly against it. Once you are able to get out of bed, walk around indoors and cough well. You may stop using the incentive spirometer when instructed by your caregiver.  RISKS AND COMPLICATIONS  Take your time so you do not get dizzy or light-headed.  If you are in pain, you may need to take or ask for pain medication before doing incentive spirometry. It is harder to take a deep breath if you are having pain. AFTER USE  Rest and breathe slowly and easily.  It can be helpful to keep track of a log of your progress. Your caregiver can provide you with a simple table to help with this. If you are using the spirometer at home, follow these instructions: [place] IF:   You are having difficultly using the spirometer.  You have trouble using the spirometer as often as instructed.  Your pain medication is not giving enough relief while using the spirometer.  You develop  fever of 100.5 F (38.1 C) or higher. SEEK IMMEDIATE MEDICAL CARE IF:   You cough up bloody sputum that had not been present before.  You develop fever of 102 F (38.9 C) or greater.  You develop worsening pain at or near the incision site. MAKE SURE YOU:   Understand these instructions.  Will watch your condition.  Will get help right away if you are not doing well or get worse. Document Released: 04/30/2006 Document Revised: 03/12/2011 Document Reviewed: 07/01/2006 Exit[employer] Patient Information 2014 Exit[employer], [place].   ________________________________________________________________________  WHAT IS A BLOOD TRANSFUSION? Blood Transfusion Information  A transfusion is the replacement of blood or some of its parts. Blood is made up of multiple cells which provide different functions.  Red blood cells carry oxygen and are used for blood loss replacement.  White blood cells fight against infection.  Platelets control bleeding.  Plasma helps clot blood.  Other blood products are available for specialized needs, such as hemophilia or other clotting disorders. BEFORE THE TRANSFUSION  Who gives blood for transfusions?   Healthy volunteers who are fully evaluated to make sure their blood is safe. This is blood bank blood. Transfusion therapy is the safest it has ever been in the practice of medicine. Before blood is taken from a donor, a complete history is taken to make sure that person has no history of diseases nor engages in risky social behavior (examples are intravenous drug use or sexual activity with multiple partners). The donor's travel history is screened to minimize risk of transmitting infections, such as malaria. The donated blood is tested for signs of infectious diseases, such as HIV and hepatitis. The blood is then tested to be sure it is compatible with you in order to minimize the chance of a transfusion reaction. If you or a relative donates blood, this is often  done in anticipation of surgery and is not appropriate for emergency situations. It takes many days to process the donated blood. RISKS AND COMPLICATIONS Although transfusion therapy is very safe and saves many lives, the main dangers of transfusion include:   Getting an infectious disease.  Developing a transfusion reaction. This is an allergic reaction to something in the blood you were given. Every precaution is taken to prevent this. The decision to have a blood transfusion has been considered carefully by your caregiver before blood is given. Blood is not given unless the benefits outweigh the risks. AFTER THE TRANSFUSION  Right after receiving a blood transfusion, you will usually feel much better and more energetic. This is especially true if your red blood cells have gotten low (anemic). The transfusion raises the level of the red blood cells which carry oxygen, and this usually causes an energy increase.  The nurse administering the transfusion will monitor you carefully for complications. HOME CARE INSTRUCTIONS  No special instructions are needed after a transfusion. You may find your energy is better. Speak with your caregiver about any limitations on activity for underlying diseases you may have. SEEK MEDICAL CARE IF:   Your condition is not improving after your transfusion.  You develop redness or irritation at the intravenous (IV) site. SEEK IMMEDIATE MEDICAL CARE IF:  Any of the following symptoms occur over the next 12 hours:  Shaking chills.  You have a temperature by mouth above 102 F (38.9 C), not controlled by medicine.  Chest, back, or muscle pain.  People around you feel you are not acting correctly or are confused.  Shortness of breath or difficulty breathing.  Dizziness and fainting.  You get a rash or develop hives.  You have a decrease in urine output.  Your urine turns a dark color or changes to pink, red, or brown. Any of the following symptoms  occur over the next 10 days:  You have a temperature by mouth above 102 F (38.9 C), not controlled by medicine.  Shortness of breath.  Weakness after normal activity.  The white part of the eye turns yellow (jaundice).  You have a decrease in the amount of urine or are urinating less often.  Your urine turns a dark color or changes to pink, red, or brown. Document Released: 12/16/1999 Document Revised: 03/12/2011 Document Reviewed: 08/04/2007 [facility] Patient Information 2014 [place], [place].  _______________________________________________________________________

## 2015-07-14 LAB — HEMOGLOBIN A1C
HEMOGLOBIN A1C: 7.2 % — AB (ref 4.8–5.6)
Mean Plasma Glucose: 160 mg/dL

## 2015-07-19 ENCOUNTER — Ambulatory Visit: Payer: Self-pay | Admitting: Orthopedic Surgery

## 2015-07-19 NOTE — H&P (Signed)
Debra Woods DOB: 02/04/1955 Married / Language: English / Race: Black or African American Female Date of Admission:  07/20/2015 CC:  Right Hip Pain History of Present Illness The patient is a 60 year old female who comes in for a preoperative History and Physical. The patient is scheduled for a right total hip arthroplasty (anterior) to be performed by Dr. Frank V. [name], MD at  Hospital on 07/20/2015. She has had severe problems with both hips for over a year now. Left hip hurt more than the right but she has since had that one replace. The right still hurts. It is limiting what she can and cannot do. She tries to do most of her activities and push through the pain and is able to do so on most occasions, but on other times, it is very difficult to push through. Right side continues to hurt as the left side continues to improve. She has had a significant loss of motion and function which has gotten progressively worse in the past year. She was at a stage where her hips essentially had taken over her life. She is doing better since the left hip was replaced and now she is ready to proceed with the right hip surgery at this time. They have been treated conservatively in the past for the above stated problem and despite conservative measures, they continue to have progressive pain and severe functional limitations and dysfunction. They have failed non-operative management including home exercise, medications. It is felt that they would benefit from undergoing total joint replacement. Risks and benefits of the procedure have been discussed with the patient and they elect to proceed with surgery. There are no active contraindications to surgery such as ongoing infection or rapidly progressive neurological disease.  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)  Impaired Vision   Anxiety Disorder  Depression  High blood pressure  Hypercholesterolemia  Gastroesophageal Reflux Disease  Diabetes Mellitus, Type II  Menopause  Urinary Incontinence - ocassionally  Allergies No Known Drug Allergies  Family History Cerebrovascular Accident  father Diabetes Mellitus  mother, father and sister Heart Disease  father Heart disease in female family member before age 55  Hypertension  mother, father and sister Rheumatoid Arthritis  mother  Social History Alcohol use  current drinker; drinks wine; 5-7 per week Tobacco use  current some days smoker; smoke(d) less than 1/2 pack(s) per day Children  2 Current work status  working full time Drug/Alcohol Rehab (Currently)  no Drug/Alcohol Rehab (Previously)  no Illicit drug use  no Living situation  live with spouse Marital status  married Number of flights of stairs before winded  2-3 Pain Contract  no Tobacco / smoke exposure  yes outdoors only Post-Surgical Plans  Home following the Right Total Hip to be performed on 07/20/2015  Medication History  Tylenol (prn) Active. Pantoprazole Sodium (40MG Tablet DR, Oral daily) Active. FreeStyle Lite Test (In Vitro) Active. MetFORMIN HCl (850MG Tablet, Oral two times daily) Active. BuPROPion HCl ER (XL) (300MG Tablet ER 24HR, Oral daily) Active. (four days a week) Lisinopril-Hydrochlorothiazide (10-12.5MG Tablet, Oral daily) Active. Atorvastatin Calcium (20MG Tablet, Oral daily) Active. Zolpidem Tartrate (10MG Tablet, 1/2 tab Oral) Active.   Past Surgical History  Hysterectomy  Date: 2006. partial (non-cancerous) Total Hip Replacement - Left [01/28/2015]: Tubal Ligation    Review of Systems General Not Present- Chills, Fatigue, Fever, Memory Loss, Night Sweats,   Weight Gain and Weight Loss. Skin Not Present- Eczema, Hives, Itching, Lesions and Rash. HEENT Not Present- Dentures, Double Vision, Headache, Hearing Loss, Tinnitus  and Visual Loss. Respiratory Not Present- Allergies, Chronic Cough, Coughing up blood, Shortness of breath at rest and Shortness of breath with exertion. Cardiovascular Not Present- Chest Pain, Difficulty Breathing Lying Down, Murmur, Palpitations, Racing/skipping heartbeats and Swelling. Gastrointestinal Not Present- Abdominal Pain, Bloody Stool, Constipation, Diarrhea, Difficulty Swallowing, Heartburn, Jaundice, Loss of appetitie, Nausea and Vomiting. Female Genitourinary Not Present- Blood in Urine, Discharge, Flank Pain, Incontinence, Painful Urination, Urgency, Urinary frequency, Urinary Retention, Urinating at Night and Weak urinary stream. Musculoskeletal Present- Joint Pain and Swelling of Extremities (ankles ocassionally). Not Present- Back Pain, Joint Swelling, Morning Stiffness, Muscle Pain, Muscle Weakness and Spasms. Neurological Not Present- Blackout spells, Difficulty with balance, Dizziness, Paralysis, Tremor and Weakness. Psychiatric Not Present- Insomnia.  Vitals  Weight: 160 lb Height: 62in Weight was reported by patient. Height was reported by patient. Body Surface Area: 1.74 m Body Mass Index: 29.26 kg/m  Pulse: 92 (Regular)  BP: 128/72 (Sitting, Right Arm, Standard)   Physical Exam General Mental Status -Alert, cooperative and good historian. General Appearance-pleasant, Not in acute distress. Orientation-Oriented X3. Build & Nutrition-Well nourished and Well developed.  Head and Neck Head-normocephalic, atraumatic . Neck Global Assessment - supple, no bruit auscultated on the right, no bruit auscultated on the left.  Eye Pupil - Bilateral-Regular and Round. Motion - Bilateral-EOMI.  ENMT Note: partial upper plate   Chest and Lung Exam Auscultation Breath sounds - clear at anterior chest wall and clear at posterior chest wall. Adventitious sounds - No Adventitious sounds.  Cardiovascular Auscultation Rhythm - Regular rate and  rhythm. Heart Sounds - S1 WNL and S2 WNL. Murmurs & Other Heart Sounds - Auscultation of the heart reveals - No Murmurs.  Abdomen Palpation/Percussion Tenderness - Abdomen is non-tender to palpation. Rigidity (guarding) - Abdomen is soft. Auscultation Auscultation of the abdomen reveals - Bowel sounds normal.  Female Genitourinary Note: Not done, not pertinent to present illness   Musculoskeletal Note: Left hip flexed to 110, rotate in 20, out 30, abduct 30 without discomfort. Gait pattern shows limp, but on the right side. Right hip flexion about 90, minimal internal rotation, about 30 external rotation and 30 abduction.   Assessment & Plan  Status post left hip replacement (E45.409(Z96.642) Primary osteoarthritis of right hip (M16.11)  Note:Surgical Plans: Right Total Hip Replacement - Anterior Approach  Disposition: Home  PCP: Dr. Merri Brunetteandace Smith  IV TXA  Anesthesia Issues: None  Signed electronically by Beckey RutterAlezandrew L Perkins, III PA-C

## 2015-07-20 ENCOUNTER — Encounter (HOSPITAL_COMMUNITY): Payer: Self-pay | Admitting: *Deleted

## 2015-07-20 ENCOUNTER — Encounter (HOSPITAL_COMMUNITY)
Admission: RE | Disposition: A | Discharge status: Home Health | Payer: Self-pay | Source: Ambulatory Visit | Attending: Orthopedic Surgery

## 2015-07-20 ENCOUNTER — Inpatient Hospital Stay (HOSPITAL_COMMUNITY): Payer: BC Managed Care – PPO | Admitting: Anesthesiology

## 2015-07-20 ENCOUNTER — Inpatient Hospital Stay (HOSPITAL_COMMUNITY): Payer: BC Managed Care – PPO

## 2015-07-20 ENCOUNTER — Inpatient Hospital Stay (HOSPITAL_COMMUNITY)
Admission: RE | Admit: 2015-07-20 | Discharge: 2015-07-21 | DRG: 470 | Disposition: A | Discharge status: Home Health | Payer: BC Managed Care – PPO | Source: Ambulatory Visit | Attending: Orthopedic Surgery | Admitting: Orthopedic Surgery

## 2015-07-20 DIAGNOSIS — Z79899 Other long term (current) drug therapy: Secondary | ICD-10-CM

## 2015-07-20 DIAGNOSIS — F17210 Nicotine dependence, cigarettes, uncomplicated: Secondary | ICD-10-CM | POA: Diagnosis present

## 2015-07-20 DIAGNOSIS — Z96649 Presence of unspecified artificial hip joint: Secondary | ICD-10-CM

## 2015-07-20 DIAGNOSIS — K219 Gastro-esophageal reflux disease without esophagitis: Secondary | ICD-10-CM | POA: Diagnosis present

## 2015-07-20 DIAGNOSIS — E785 Hyperlipidemia, unspecified: Secondary | ICD-10-CM | POA: Diagnosis present

## 2015-07-20 DIAGNOSIS — Z833 Family history of diabetes mellitus: Secondary | ICD-10-CM | POA: Diagnosis not present

## 2015-07-20 DIAGNOSIS — I10 Essential (primary) hypertension: Secondary | ICD-10-CM | POA: Diagnosis present

## 2015-07-20 DIAGNOSIS — M1611 Unilateral primary osteoarthritis, right hip: Secondary | ICD-10-CM | POA: Diagnosis present

## 2015-07-20 DIAGNOSIS — Z8261 Family history of arthritis: Secondary | ICD-10-CM

## 2015-07-20 DIAGNOSIS — M169 Osteoarthritis of hip, unspecified: Secondary | ICD-10-CM | POA: Diagnosis present

## 2015-07-20 DIAGNOSIS — E119 Type 2 diabetes mellitus without complications: Secondary | ICD-10-CM | POA: Diagnosis present

## 2015-07-20 DIAGNOSIS — Z96642 Presence of left artificial hip joint: Secondary | ICD-10-CM | POA: Diagnosis present

## 2015-07-20 DIAGNOSIS — M25551 Pain in right hip: Secondary | ICD-10-CM | POA: Diagnosis present

## 2015-07-20 HISTORY — PX: TOTAL HIP ARTHROPLASTY: SHX124

## 2015-07-20 LAB — TYPE AND SCREEN
ABO/RH(D): O POS
ANTIBODY SCREEN: NEGATIVE

## 2015-07-20 LAB — GLUCOSE, CAPILLARY
GLUCOSE-CAPILLARY: 140 mg/dL — AB (ref 65–99)
GLUCOSE-CAPILLARY: 211 mg/dL — AB (ref 65–99)
GLUCOSE-CAPILLARY: 270 mg/dL — AB (ref 65–99)

## 2015-07-20 SURGERY — ARTHROPLASTY, HIP, TOTAL, ANTERIOR APPROACH
Anesthesia: Spinal | Site: Hip | Laterality: Right

## 2015-07-20 MED ORDER — ONDANSETRON HCL 4 MG/2ML IJ SOLN
INTRAMUSCULAR | Status: DC | PRN
  Administered 2015-07-20: 4 mg via INTRAVENOUS

## 2015-07-20 MED ORDER — ACETAMINOPHEN 10 MG/ML IV SOLN
1000.0000 mg | Freq: Once | INTRAVENOUS | Status: AC
  Administered 2015-07-20: 1000 mg via INTRAVENOUS

## 2015-07-20 MED ORDER — MENTHOL 3 MG MT LOZG
1.0000 | LOZENGE | OROMUCOSAL | Status: DC | PRN
Start: 2015-07-20 — End: 2015-07-21

## 2015-07-20 MED ORDER — PHENYLEPHRINE 40 MCG/ML (10ML) SYRINGE FOR IV PUSH (FOR BLOOD PRESSURE SUPPORT)
PREFILLED_SYRINGE | INTRAVENOUS | Status: AC
  Filled 2015-07-20: qty 10

## 2015-07-20 MED ORDER — BUPIVACAINE HCL (PF) 0.25 % IJ SOLN
INTRAMUSCULAR | Status: DC | PRN
  Administered 2015-07-20: 30 mL

## 2015-07-20 MED ORDER — PHENYLEPHRINE 40 MCG/ML (10ML) SYRINGE FOR IV PUSH (FOR BLOOD PRESSURE SUPPORT)
PREFILLED_SYRINGE | INTRAVENOUS | Status: DC | PRN
  Administered 2015-07-20 (×3): 80 ug via INTRAVENOUS

## 2015-07-20 MED ORDER — CEFAZOLIN SODIUM-DEXTROSE 2-4 GM/100ML-% IV SOLN
2.0000 g | Freq: Four times a day (QID) | INTRAVENOUS | Status: AC
  Administered 2015-07-20 – 2015-07-21 (×2): 2 g via INTRAVENOUS
  Filled 2015-07-20 (×2): qty 100

## 2015-07-20 MED ORDER — METHOCARBAMOL 1000 MG/10ML IJ SOLN
500.0000 mg | Freq: Four times a day (QID) | INTRAVENOUS | Status: DC | PRN
  Filled 2015-07-20: qty 5

## 2015-07-20 MED ORDER — INSULIN ASPART 100 UNIT/ML ~~LOC~~ SOLN
0.0000 [IU] | Freq: Three times a day (TID) | SUBCUTANEOUS | Status: DC
  Administered 2015-07-21: 5 [IU] via SUBCUTANEOUS
  Administered 2015-07-21: 2 [IU] via SUBCUTANEOUS

## 2015-07-20 MED ORDER — INSULIN ASPART 100 UNIT/ML ~~LOC~~ SOLN
3.0000 [IU] | Freq: Once | SUBCUTANEOUS | Status: AC
  Administered 2015-07-20: 3 [IU] via SUBCUTANEOUS

## 2015-07-20 MED ORDER — METOCLOPRAMIDE HCL 5 MG PO TABS: 5.0000 mg | ORAL_TABLET | Freq: Three times a day (TID) | ORAL | Status: DC | PRN

## 2015-07-20 MED ORDER — SODIUM CHLORIDE 0.9 % IV SOLN
INTRAVENOUS | Status: DC
  Administered 2015-07-20: 75 mL/h via INTRAVENOUS
  Administered 2015-07-21: 03:00:00 via INTRAVENOUS

## 2015-07-20 MED ORDER — LIDOCAINE HCL (CARDIAC) 20 MG/ML IV SOLN
INTRAVENOUS | Status: DC | PRN
  Administered 2015-07-20: 50 mg via INTRAVENOUS

## 2015-07-20 MED ORDER — FUROSEMIDE 20 MG PO TABS: 20.0000 mg | ORAL_TABLET | Freq: Every day | ORAL | Status: DC | PRN

## 2015-07-20 MED ORDER — MIDAZOLAM HCL 2 MG/2ML IJ SOLN
INTRAMUSCULAR | Status: AC
  Filled 2015-07-20: qty 2

## 2015-07-20 MED ORDER — STERILE WATER FOR IRRIGATION IR SOLN
Status: DC | PRN
  Administered 2015-07-20: 3000 mL

## 2015-07-20 MED ORDER — ONDANSETRON HCL 4 MG/2ML IJ SOLN
INTRAMUSCULAR | Status: AC
  Filled 2015-07-20: qty 2

## 2015-07-20 MED ORDER — PROPOFOL 10 MG/ML IV BOLUS
INTRAVENOUS | Status: AC
  Filled 2015-07-20: qty 60

## 2015-07-20 MED ORDER — ACETAMINOPHEN 10 MG/ML IV SOLN
INTRAVENOUS | Status: AC
  Filled 2015-07-20: qty 100

## 2015-07-20 MED ORDER — DEXAMETHASONE SODIUM PHOSPHATE 10 MG/ML IJ SOLN
INTRAMUSCULAR | Status: AC
Start: 2015-07-20 — End: 2015-07-20
  Filled 2015-07-20: qty 2

## 2015-07-20 MED ORDER — ONDANSETRON HCL 4 MG/2ML IJ SOLN: 4.0000 mg | Freq: Four times a day (QID) | INTRAMUSCULAR | Status: DC | PRN

## 2015-07-20 MED ORDER — METHOCARBAMOL 500 MG PO TABS
500.0000 mg | ORAL_TABLET | Freq: Four times a day (QID) | ORAL | Status: DC | PRN
  Administered 2015-07-20 – 2015-07-21 (×2): 500 mg via ORAL
  Filled 2015-07-20 (×2): qty 1

## 2015-07-20 MED ORDER — DOCUSATE SODIUM 100 MG PO CAPS
100.0000 mg | ORAL_CAPSULE | Freq: Two times a day (BID) | ORAL | Status: DC
Start: 2015-07-20 — End: 2015-07-21
  Administered 2015-07-20 – 2015-07-21 (×2): 100 mg via ORAL
  Filled 2015-07-20 (×2): qty 1

## 2015-07-20 MED ORDER — FENTANYL CITRATE (PF) 100 MCG/2ML IJ SOLN
INTRAMUSCULAR | Status: DC | PRN
  Administered 2015-07-20: 100 ug via INTRAVENOUS

## 2015-07-20 MED ORDER — TRANEXAMIC ACID 1000 MG/10ML IV SOLN
1000.0000 mg | Freq: Once | INTRAVENOUS | Status: AC
  Administered 2015-07-20: 1000 mg via INTRAVENOUS
  Filled 2015-07-20: qty 10

## 2015-07-20 MED ORDER — ATORVASTATIN CALCIUM 20 MG PO TABS
20.0000 mg | ORAL_TABLET | Freq: Every day | ORAL | Status: DC
  Administered 2015-07-21: 20 mg via ORAL
  Filled 2015-07-20: qty 1

## 2015-07-20 MED ORDER — FENTANYL CITRATE (PF) 100 MCG/2ML IJ SOLN
INTRAMUSCULAR | Status: AC
  Filled 2015-07-20: qty 2

## 2015-07-20 MED ORDER — ACETAMINOPHEN 325 MG PO TABS: 650.0000 mg | ORAL_TABLET | Freq: Four times a day (QID) | ORAL | Status: DC | PRN

## 2015-07-20 MED ORDER — MORPHINE SULFATE (PF) 2 MG/ML IV SOLN
1.0000 mg | INTRAVENOUS | Status: DC | PRN
Start: 2015-07-20 — End: 2015-07-21
  Administered 2015-07-21: 1 mg via INTRAVENOUS
  Filled 2015-07-20: qty 1

## 2015-07-20 MED ORDER — CHLORHEXIDINE GLUCONATE 4 % EX LIQD: 60.0000 mL | Freq: Once | CUTANEOUS | Status: DC

## 2015-07-20 MED ORDER — METOCLOPRAMIDE HCL 5 MG/ML IJ SOLN: 5.0000 mg | Freq: Three times a day (TID) | INTRAMUSCULAR | Status: DC | PRN

## 2015-07-20 MED ORDER — PROMETHAZINE HCL 25 MG/ML IJ SOLN: 6.2500 mg | INTRAMUSCULAR | Status: DC | PRN

## 2015-07-20 MED ORDER — ACETAMINOPHEN 650 MG RE SUPP: 650.0000 mg | Freq: Four times a day (QID) | RECTAL | Status: DC | PRN

## 2015-07-20 MED ORDER — CEFAZOLIN SODIUM-DEXTROSE 2-4 GM/100ML-% IV SOLN
INTRAVENOUS | Status: AC
  Filled 2015-07-20: qty 100

## 2015-07-20 MED ORDER — LIDOCAINE HCL (CARDIAC) 20 MG/ML IV SOLN
INTRAVENOUS | Status: AC
  Filled 2015-07-20: qty 5

## 2015-07-20 MED ORDER — ACETAMINOPHEN 500 MG PO TABS
1000.0000 mg | ORAL_TABLET | Freq: Four times a day (QID) | ORAL | Status: DC
  Administered 2015-07-20 – 2015-07-21 (×3): 1000 mg via ORAL
  Filled 2015-07-20 (×4): qty 2

## 2015-07-20 MED ORDER — ZOLPIDEM TARTRATE 5 MG PO TABS
5.0000 mg | ORAL_TABLET | Freq: Every evening | ORAL | Status: DC | PRN
Start: 2015-07-20 — End: 2015-07-21

## 2015-07-20 MED ORDER — OXYCODONE HCL 5 MG PO TABS
5.0000 mg | ORAL_TABLET | ORAL | Status: DC | PRN
  Administered 2015-07-20: 10 mg via ORAL
  Administered 2015-07-20 (×2): 5 mg via ORAL
  Administered 2015-07-21 (×5): 10 mg via ORAL
  Filled 2015-07-20 (×4): qty 2
  Filled 2015-07-20: qty 1
  Filled 2015-07-20 (×3): qty 2

## 2015-07-20 MED ORDER — ONDANSETRON HCL 4 MG PO TABS: 4.0000 mg | ORAL_TABLET | Freq: Four times a day (QID) | ORAL | Status: DC | PRN

## 2015-07-20 MED ORDER — HYDROMORPHONE HCL 1 MG/ML IJ SOLN
0.2500 mg | INTRAMUSCULAR | Status: DC | PRN
  Administered 2015-07-20 (×2): 0.5 mg via INTRAVENOUS

## 2015-07-20 MED ORDER — BUPIVACAINE HCL (PF) 0.25 % IJ SOLN
INTRAMUSCULAR | Status: AC
  Filled 2015-07-20: qty 30

## 2015-07-20 MED ORDER — RIVAROXABAN 10 MG PO TABS
10.0000 mg | ORAL_TABLET | Freq: Every day | ORAL | Status: DC
  Administered 2015-07-21: 10 mg via ORAL
  Filled 2015-07-20: qty 1

## 2015-07-20 MED ORDER — DIPHENHYDRAMINE HCL 12.5 MG/5ML PO ELIX: 12.5000 mg | ORAL_SOLUTION | ORAL | Status: DC | PRN

## 2015-07-20 MED ORDER — MIDAZOLAM HCL 2 MG/2ML IJ SOLN: 0.5000 mg | Freq: Once | INTRAMUSCULAR | Status: DC | PRN

## 2015-07-20 MED ORDER — LACTATED RINGERS IV SOLN
INTRAVENOUS | Status: DC
  Administered 2015-07-20 (×3): via INTRAVENOUS

## 2015-07-20 MED ORDER — HYDROMORPHONE HCL 1 MG/ML IJ SOLN
INTRAMUSCULAR | Status: AC
  Filled 2015-07-20: qty 1

## 2015-07-20 MED ORDER — PHENYLEPHRINE HCL 10 MG/ML IJ SOLN
10.0000 mg | INTRAVENOUS | Status: DC | PRN
  Administered 2015-07-20: 25 ug/min via INTRAVENOUS

## 2015-07-20 MED ORDER — POLYETHYLENE GLYCOL 3350 17 G PO PACK: 17.0000 g | PACK | Freq: Every day | ORAL | Status: DC | PRN

## 2015-07-20 MED ORDER — BUPIVACAINE IN DEXTROSE 0.75-8.25 % IT SOLN
INTRATHECAL | Status: DC | PRN
  Administered 2015-07-20: 13.5 mg via INTRATHECAL

## 2015-07-20 MED ORDER — PROPOFOL 500 MG/50ML IV EMUL
INTRAVENOUS | Status: DC | PRN
  Administered 2015-07-20: 75 ug/kg/min via INTRAVENOUS

## 2015-07-20 MED ORDER — PHENOL 1.4 % MT LIQD
1.0000 | OROMUCOSAL | Status: DC | PRN
Start: 2015-07-20 — End: 2015-07-21

## 2015-07-20 MED ORDER — DEXAMETHASONE SODIUM PHOSPHATE 10 MG/ML IJ SOLN
10.0000 mg | Freq: Once | INTRAMUSCULAR | Status: AC
Start: 2015-07-20 — End: 2015-07-20
  Administered 2015-07-20: 10 mg via INTRAVENOUS

## 2015-07-20 MED ORDER — PHENYLEPHRINE HCL 10 MG/ML IJ SOLN: 30.0000 ug/min | INTRAVENOUS | Status: DC

## 2015-07-20 MED ORDER — MIDAZOLAM HCL 5 MG/5ML IJ SOLN
INTRAMUSCULAR | Status: DC | PRN
  Administered 2015-07-20: 2 mg via INTRAVENOUS

## 2015-07-20 MED ORDER — DEXAMETHASONE SODIUM PHOSPHATE 10 MG/ML IJ SOLN
10.0000 mg | Freq: Once | INTRAMUSCULAR | Status: AC
  Administered 2015-07-21: 10 mg via INTRAVENOUS
  Filled 2015-07-20: qty 1

## 2015-07-20 MED ORDER — METFORMIN HCL 850 MG PO TABS
850.0000 mg | ORAL_TABLET | Freq: Two times a day (BID) | ORAL | Status: DC
  Filled 2015-07-20: qty 1

## 2015-07-20 MED ORDER — BUPROPION HCL ER (XL) 300 MG PO TB24
300.0000 mg | ORAL_TABLET | Freq: Every morning | ORAL | Status: DC
  Administered 2015-07-21: 300 mg via ORAL
  Filled 2015-07-20: qty 1

## 2015-07-20 MED ORDER — FLEET ENEMA 7-19 GM/118ML RE ENEM: 1.0000 | ENEMA | Freq: Once | RECTAL | Status: DC | PRN

## 2015-07-20 MED ORDER — CEFAZOLIN SODIUM-DEXTROSE 2-4 GM/100ML-% IV SOLN
2.0000 g | INTRAVENOUS | Status: AC
  Administered 2015-07-20: 2 g via INTRAVENOUS

## 2015-07-20 MED ORDER — TRANEXAMIC ACID 1000 MG/10ML IV SOLN
1000.0000 mg | INTRAVENOUS | Status: AC
  Administered 2015-07-20: 1000 mg via INTRAVENOUS
  Filled 2015-07-20: qty 10

## 2015-07-20 MED ORDER — PANTOPRAZOLE SODIUM 40 MG PO TBEC
40.0000 mg | DELAYED_RELEASE_TABLET | Freq: Every day | ORAL | Status: DC
  Administered 2015-07-21: 40 mg via ORAL
  Filled 2015-07-20: qty 1

## 2015-07-20 MED ORDER — BISACODYL 10 MG RE SUPP: 10.0000 mg | Freq: Every day | RECTAL | Status: DC | PRN

## 2015-07-20 MED ORDER — MEPERIDINE HCL 50 MG/ML IJ SOLN: 6.2500 mg | INTRAMUSCULAR | Status: DC | PRN

## 2015-07-20 SURGICAL SUPPLY — 37 items
BAG DECANTER FOR FLEXI CONT (MISCELLANEOUS) ×1 IMPLANT
BAG SPEC THK2 15X12 ZIP CLS (MISCELLANEOUS)
BAG ZIPLOCK 12X15 (MISCELLANEOUS) IMPLANT
BLADE SAG 18X100X1.27 (BLADE) ×3 IMPLANT
CAPT HIP TOTAL 2 ×2 IMPLANT
CLOSURE WOUND 1/2 X4 (GAUZE/BANDAGES/DRESSINGS) ×1
CLOTH BEACON ORANGE TIMEOUT ST (SAFETY) ×3 IMPLANT
COVER PERINEAL POST (MISCELLANEOUS) ×3 IMPLANT
DECANTER SPIKE VIAL GLASS SM (MISCELLANEOUS) ×1 IMPLANT
DRAPE STERI IOBAN 125X83 (DRAPES) ×3 IMPLANT
DRAPE U-SHAPE 47X51 STRL (DRAPES) ×6 IMPLANT
DRSG ADAPTIC 3X8 NADH LF (GAUZE/BANDAGES/DRESSINGS) ×3 IMPLANT
DRSG MEPILEX BORDER 4X4 (GAUZE/BANDAGES/DRESSINGS) ×3 IMPLANT
DRSG MEPILEX BORDER 4X8 (GAUZE/BANDAGES/DRESSINGS) ×3 IMPLANT
DURAPREP 26ML APPLICATOR (WOUND CARE) ×3 IMPLANT
ELECT REM PT RETURN 9FT ADLT (ELECTROSURGICAL) ×3
ELECTRODE REM PT RTRN 9FT ADLT (ELECTROSURGICAL) ×1 IMPLANT
EVACUATOR 1/8 PVC DRAIN (DRAIN) ×3 IMPLANT
GLOVE BIO SURGEON STRL SZ7.5 (GLOVE) ×1 IMPLANT
GLOVE BIO SURGEON STRL SZ8 (GLOVE) ×4 IMPLANT
GLOVE BIOGEL PI IND STRL 7.0 (GLOVE) IMPLANT
GLOVE BIOGEL PI IND STRL 8 (GLOVE) ×2 IMPLANT
GLOVE BIOGEL PI INDICATOR 7.0 (GLOVE) ×8
GLOVE BIOGEL PI INDICATOR 8 (GLOVE) ×12
GOWN STRL REUS W/TWL LRG LVL3 (GOWN DISPOSABLE) ×7 IMPLANT
GOWN STRL REUS W/TWL XL LVL3 (GOWN DISPOSABLE) ×3 IMPLANT
PACK ANTERIOR HIP CUSTOM (KITS) ×3 IMPLANT
STRIP CLOSURE SKIN 1/2X4 (GAUZE/BANDAGES/DRESSINGS) ×2 IMPLANT
SUT ETHIBOND NAB CT1 #1 30IN (SUTURE) ×3 IMPLANT
SUT MNCRL AB 4-0 PS2 18 (SUTURE) ×3 IMPLANT
SUT VIC AB 2-0 CT1 27 (SUTURE) ×6
SUT VIC AB 2-0 CT1 TAPERPNT 27 (SUTURE) ×2 IMPLANT
SUT VLOC 180 0 24IN GS25 (SUTURE) ×3 IMPLANT
SYR 50ML LL SCALE MARK (SYRINGE) IMPLANT
TRAY FOLEY W/METER SILVER 14FR (SET/KITS/TRAYS/PACK) ×3 IMPLANT
TRAY FOLEY W/METER SILVER 16FR (SET/KITS/TRAYS/PACK) ×1 IMPLANT
YANKAUER SUCT BULB TIP 10FT TU (MISCELLANEOUS) ×3 IMPLANT

## 2015-07-20 NOTE — H&P (View-Only) (Signed)
Debra Woods DOB: [birth year] Married / Language: English / Race: Black or African American Female Date of Admission:  07/20/2015 CC:  Right Hip Pain History of Present Illness The patient is a 60 year old female who comes in for a preoperative History and Physical. The patient is scheduled for a right total hip arthroplasty (anterior) to be performed by Dr. [name]. [name], MD at [facility] on 07/20/2015. She has had severe problems with both hips for over a year now. Left hip hurt more than the right but she has since had that one replace. The right still hurts. It is limiting what she can and cannot do. She tries to do most of her activities and push through the pain and is able to do so on most occasions, but on other times, it is very difficult to push through. Right side continues to hurt as the left side continues to improve. She has had a significant loss of motion and function which has gotten progressively worse in the past year. She was at a stage where her hips essentially had taken over her life. She is doing better since the left hip was replaced and now she is ready to proceed with the right hip surgery at this time. They have been treated conservatively in the past for the above stated problem and despite conservative measures, they continue to have progressive pain and severe functional limitations and dysfunction. They have failed non-operative management including home exercise, medications. It is felt that they would benefit from undergoing total joint replacement. Risks and benefits of the procedure have been discussed with the patient and they elect to proceed with surgery. There are no active contraindications to surgery such as ongoing infection or rapidly progressive neurological disease.  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)  Impaired Vision   Anxiety Disorder  Depression  High blood pressure  Hypercholesterolemia  Gastroesophageal Reflux Disease  Diabetes Mellitus, Type II  Menopause  Urinary Incontinence - ocassionally  Allergies No Known Drug Allergies  Family History Cerebrovascular Accident  father Diabetes Mellitus  mother, father and sister Heart Disease  father Heart disease in female family member before age 62  Hypertension  mother, father and sister Rheumatoid Arthritis  mother  Social History Alcohol use  current drinker; drinks wine; 5-7 per week Tobacco use  current some days smoker; smoke(d) less than 1/2 pack(s) per day Children  2 Current work status  working full time Drug/Alcohol Rehab (Currently)  no Drug/Alcohol Rehab (Previously)  no Illicit drug use  no Living situation  live with spouse Marital status  married Number of flights of stairs before winded  2-3 Pain Contract  no Tobacco / smoke exposure  yes outdoors only Post-Surgical Plans  Home following the Right Total Hip to be performed on 07/20/2015  Medication History  Tylenol (prn) Active. Pantoprazole Sodium (  Tablet DR, Oral daily) Active. FreeStyle Lite Test (In Vitro) Active. MetFORMIN HCl (  Tablet, Oral two times daily) Active. BuPROPion HCl ER (XL) (  Tablet ER 24HR, Oral daily) Active. (four days a week) Lisinopril-Hydrochlorothiazide (10-12.5MG  Tablet, Oral daily) Active. Atorvastatin Calcium (  Tablet, Oral daily) Active. Zolpidem Tartrate (  Tablet, 1/2 tab Oral) Active.   Past Surgical History  Hysterectomy  Date: 2006. partial (non-cancerous) Total Hip Replacement - Left [01/28/2015]: Tubal Ligation    Review of Systems General Not Present- Chills, Fatigue, Fever, Memory Loss, Night Sweats,  Weight Gain and Weight Loss. Skin Not Present- Eczema, Hives, Itching, Lesions and Rash. HEENT Not Present- Dentures, Double Vision, Headache, Hearing Loss, Tinnitus  and Visual Loss. Respiratory Not Present- Allergies, Chronic Cough, Coughing up blood, Shortness of breath at rest and Shortness of breath with exertion. Cardiovascular Not Present- Chest Pain, Difficulty Breathing Lying Down, Murmur, Palpitations, Racing/skipping heartbeats and Swelling. Gastrointestinal Not Present- Abdominal Pain, Bloody Stool, Constipation, Diarrhea, Difficulty Swallowing, Heartburn, Jaundice, Loss of appetitie, Nausea and Vomiting. Female Genitourinary Not Present- Blood in Urine, Discharge, Flank Pain, Incontinence, Painful Urination, Urgency, Urinary frequency, Urinary Retention, Urinating at Night and Weak urinary stream. Musculoskeletal Present- Joint Pain and Swelling of Extremities (ankles ocassionally). Not Present- Back Pain, Joint Swelling, Morning Stiffness, Muscle Pain, Muscle Weakness and Spasms. Neurological Not Present- Blackout spells, Difficulty with balance, Dizziness, Paralysis, Tremor and Weakness. Psychiatric Not Present- Insomnia.  Vitals  Weight: 160 lb Height: 62in Weight was reported by patient. Height was reported by patient. Body Surface Area: 1.74 m Body Mass Index: 29.26 kg/m  Pulse: 92 (Regular)  BP: 128/72 (Sitting, Right Arm, Standard)   Physical Exam General Mental Status -Alert, cooperative and good historian. General Appearance-pleasant, Not in acute distress. Orientation-Oriented X3. Build & Nutrition-Well nourished and Well developed.  Head and Neck Head-normocephalic, atraumatic . Neck Global Assessment - supple, no bruit auscultated on the right, no bruit auscultated on the left.  Eye Pupil - Bilateral-Regular and Round. Motion - Bilateral-EOMI.  ENMT Note: partial upper plate   Chest and Lung Exam Auscultation Breath sounds - clear at anterior chest wall and clear at posterior chest wall. Adventitious sounds - No Adventitious sounds.  Cardiovascular Auscultation Rhythm - Regular rate and  rhythm. Heart Sounds - S1 WNL and S2 WNL. Murmurs & Other Heart Sounds - Auscultation of the heart reveals - No Murmurs.  Abdomen Palpation/Percussion Tenderness - Abdomen is non-tender to palpation. Rigidity (guarding) - Abdomen is soft. Auscultation Auscultation of the abdomen reveals - Bowel sounds normal.  Female Genitourinary Note: Not done, not pertinent to present illness   Musculoskeletal Note: Left hip flexed to 110, rotate in 20, out 30, abduct 30 without discomfort. Gait pattern shows limp, but on the right side. Right hip flexion about 90, minimal internal rotation, about 30 external rotation and 30 abduction.   Assessment & Plan  Status post left hip replacement (E45.409(Z96.642) Primary osteoarthritis of right hip (M16.11)  Note:Surgical Plans: Right Total Hip Replacement - Anterior Approach  Disposition: Home  PCP: Dr. Merri Brunetteandace Smith  IV TXA  Anesthesia Issues: None  Signed electronically by Beckey RutterAlezandrew L Tychelle Purkey, III PA-C

## 2015-07-20 NOTE — Anesthesia Preprocedure Evaluation (Addendum)
Anesthesia Evaluation  Patient identified by MRN, date of birth, ID band Patient awake    Reviewed: Allergy & Precautions, NPO status , Patient's Chart, lab work & pertinent test results  History of Anesthesia Complications Negative for: history of anesthetic complications  Airway Mallampati: III  TM Distance: >3 FB Neck ROM: Full    Dental  (+) Missing, Poor Dentition, Chipped, Dental Advisory Given   Pulmonary Current Smoker,    breath sounds clear to auscultation       Cardiovascular hypertension, Pt. on medications (-) angina Rhythm:Regular Rate:Normal     Neuro/Psych Anxiety negative neurological ROS     GI/Hepatic Neg liver ROS, GERD  Medicated and Controlled,  Endo/Other  diabetes (glu 140), Oral Hypoglycemic Agents  Renal/GU negative Renal ROS     Musculoskeletal  (+) Arthritis , Osteoarthritis,    Abdominal   Peds  Hematology xarelto   Anesthesia Other Findings   Reproductive/Obstetrics                            Anesthesia Physical Anesthesia Plan  ASA: II  Anesthesia Plan: Spinal   Post-op Pain Management:    Induction:   Airway Management Planned: Natural Airway and Simple Face Mask  Additional Equipment:   Intra-op Plan:   Post-operative Plan:   Informed Consent: I have reviewed the patients History and Physical, chart, labs and discussed the procedure including the risks, benefits and alternatives for the proposed anesthesia with the patient or authorized representative who has indicated his/her understanding and acceptance.   Dental advisory given  Plan Discussed with: CRNA and Surgeon  Anesthesia Plan Comments: (Plan routine monitors, SAB)        Anesthesia Quick Evaluation

## 2015-07-20 NOTE — Transfer of Care (Signed)
Immediate Anesthesia Transfer of Care Note  Patient: Debra Woods  Procedure(s) Performed: Procedure(s): RIGHT TOTAL HIP ARTHROPLASTY ANTERIOR APPROACH (Right)  Patient Location: PACU  Anesthesia Type:Spinal  Level of Consciousness:  sedated, patient cooperative and responds to stimulation  Airway & Oxygen Therapy:Patient Spontanous Breathing and Patient connected to face mask oxgen  Post-op Assessment:  Report given to PACU RN and Post -op Vital signs reviewed and stable  Post vital signs:  Reviewed and stable  Last Vitals:  Filed Vitals:   07/20/15 0946  BP: 121/74  Pulse: 91  Temp: 36.7 C  Resp: 18    Complications: No apparent anesthesia complications

## 2015-07-20 NOTE — Interval H&P Note (Signed)
History and Physical Interval Note:  07/20/2015 12:16 PM  [name]  has presented today for surgery, with the diagnosis of RIGHT HIP OA  The various methods of treatment have been discussed with the patient and family. After consideration of risks, benefits and other options for treatment, the patient has consented to  Procedure(s): RIGHT TOTAL HIP ARTHROPLASTY ANTERIOR APPROACH (Right) as a surgical intervention .  The patient's history has been reviewed, patient examined, no change in status, stable for surgery.  I have reviewed the patient's chart and labs.  Questions were answered to the patient's satisfaction.     Loanne DrillingALUISIO,Arabell Neria V

## 2015-07-20 NOTE — Anesthesia Procedure Notes (Signed)
Spinal Patient location during procedure: OR End time: 07/20/2015 1:05 PM Staffing Anesthesiologist: Jairo BenJACKSON, Darrian Goodwill Resident/CRNA: KEY, KRISTOPHER Performed by: anesthesiologist and resident/CRNA  Preanesthetic Checklist Completed: patient identified, site marked, surgical consent, pre-op evaluation, timeout performed, IV checked, risks and benefits discussed and monitors and equipment checked Spinal Block Patient position: sitting Prep: Betadine, ChloraPrep and site prepped and draped Patient monitoring: heart rate, continuous pulse ox and blood pressure Approach: midline Location: L2-3 Injection technique: single-shot Needle Needle type: Quincke (CRNA unsuccessful with Sprotte)  Needle gauge: 25 G Needle length: 9 cm Additional Notes Pt identified in Operating room.  Monitors applied. Working IV access confirmed. Sterile prep, drape lumbar spine.  1% lido local L 3,4 by Key, CRNA.  After CRNA unsuccessful with Sprotte, #25ga Quincke into clear CSF L 2,3.  13.5 mg 0.75% Bupivacaine with dextrose injected with asp CSF beginning and end of injection.  Patient asymptomatic, VSS, no heme aspirated, tolerated well.  Sandford Craze Bari Leib, MD

## 2015-07-20 NOTE — Anesthesia Postprocedure Evaluation (Signed)
Anesthesia Post Note  Patient: Debra Woods  Procedure(s) Performed: Procedure(s) (LRB): RIGHT TOTAL HIP ARTHROPLASTY ANTERIOR APPROACH (Right)  Patient location during evaluation: PACU Anesthesia Type: Spinal Level of consciousness: awake and alert, oriented and patient cooperative Pain management: pain level controlled Vital Signs Assessment: post-procedure vital signs reviewed and stable Respiratory status: spontaneous breathing, nonlabored ventilation and respiratory function stable Cardiovascular status: blood pressure returned to baseline and stable Postop Assessment: spinal receding, patient able to bend at knees and no signs of nausea or vomiting Anesthetic complications: no    Last Vitals:  Filed Vitals:   07/20/15 1524 07/20/15 1530  BP: 103/62 110/70  Pulse: 76 73  Temp:    Resp: 12 14    Last Pain:  Filed Vitals:   07/20/15 1533  PainSc: 4     LLE Motor Response: Responds to commands (07/20/15 1530) LLE Sensation: Numbness (due to spinal) (07/20/15 1530) RLE Motor Response: Responds to commands (07/20/15 1530) RLE Sensation: Numbness (due to spinal) (07/20/15 1530) L Sensory Level: S1-Sole of foot, small toes (07/20/15 1530) R Sensory Level: S1-Sole of foot, small toes (07/20/15 1530)  Alin Chavira,E. Shaylon Gillean

## 2015-07-20 NOTE — Op Note (Signed)
OPERATIVE REPORT  PREOPERATIVE DIAGNOSIS: Osteoarthritis of the Right hip.   POSTOPERATIVE DIAGNOSIS: Osteoarthritis of the Right  hip.   PROCEDURE: Right total hip arthroplasty, anterior approach.   SURGEON: Debra Gross, MD   ASSISTANT: Dimitri Ped, PA-C  ANESTHESIA:  Spinal  ESTIMATED BLOOD LOSS:-200 ml  DRAINS: Hemovac x1.   COMPLICATIONS: None   CONDITION: PACU - hemodynamically stable.   BRIEF CLINICAL NOTE: Debra Woods is a 60 y.o. female who has advanced end-  stage arthritis of her Right  hip with progressively worsening pain and  dysfunction.The patient has failed nonoperative management and presents for  total hip arthroplasty.   PROCEDURE IN DETAIL: After successful administration of spinal  anesthetic, the traction boots for the [facility] bed were placed on both  feet and the patient was placed onto the [facility] bed, boots placed into the leg  holders. The Right hip was then isolated from the perineum with plastic  drapes and prepped and draped in the usual sterile fashion. ASIS and  greater trochanter were marked and a oblique incision was made, starting  at about 1 cm lateral and 2 cm distal to the ASIS and coursing towards  the anterior cortex of the femur. The skin was cut with a 10 blade  through subcutaneous tissue to the level of the fascia overlying the  tensor fascia lata muscle. The fascia was then incised in line with the  incision at the junction of the anterior third and posterior 2/3rd. The  muscle was teased off the fascia and then the interval between the TFL  and the rectus was developed. The Hohmann retractor was then placed at  the top of the femoral neck over the capsule. The vessels overlying the  capsule were cauterized and the fat on top of the capsule was removed.  A Hohmann retractor was then placed anterior underneath the rectus  femoris to give exposure to the entire anterior capsule. A T-shaped  capsulotomy was  performed. The edges were tagged and the femoral head  was identified.       Osteophytes are removed off the superior acetabulum.  The femoral neck was then cut in situ with an oscillating saw. Traction  was then applied to the left lower extremity utilizing the [facility]  traction. The femoral head was then removed. Retractors were placed  around the acetabulum and then circumferential removal of the labrum was  performed. Osteophytes were also removed. Reaming starts at 43 mm to  medialize and  Increased in 2 mm increments to 47 mm. We reamed in  approximately 40 degrees of abduction, 20 degrees anteversion. A 48 mm  pinnacle acetabular shell was then impacted in anatomic position under  fluoroscopic guidance with excellent purchase. We did not need to place  any additional dome screws. A 28 mm neutral + 4 marathon liner was then  placed into the acetabular shell.       The femoral lift was then placed along the lateral aspect of the femur  just distal to the vastus ridge. The leg was  externally rotated and capsule  was stripped off the inferior aspect of the femoral neck down to the  level of the lesser trochanter, this was done with electrocautery. The femur was lifted after this was performed. The  leg was then placed and extended in adducted position to essentially delivering the femur. We also removed the capsule superiorly and the  piriformis from the piriformis fossa  to gain excellent exposure of the  proximal femur. Rongeur was used to remove some cancellous bone to get  into the lateral portion of the proximal femur for placement of the  initial starter reamer. The starter broaches was placed  the starter broach  and was shown to go down the center of the canal. Broaching  with the  Corail system was then performed starting at size 8, coursing  Up to size 9. A size 9 had excellent torsional and rotational  and axial stability. The trial high offset neck was then placed  with a 28 +  1.5 trial head. The hip was then reduced. We confirmed that  the stem was in the canal both on AP and lateral x-rays. It also has excellent sizing. The hip was reduced with outstanding stability through full extension, full external rotation,  and then flexion in adduction internal rotation. AP pelvis was taken  and the leg lengths were measured and found to be exactly equal. Hip  was then dislocated again and the femoral head and neck removed. The  femoral broach was removed. Size 9 Corail stem with a high offset  neck was then impacted into the femur following native anteversion. Has  excellent purchase in the canal. Excellent torsional and rotational and  axial stability. It is confirmed to be in the canal on AP and lateral  fluoroscopic views. The 28 + 1.5 ceramic head was placed and the hip  reduced with outstanding stability. Again AP pelvis was taken and it  confirmed that the leg lengths were equal. The wound was then copiously  irrigated with saline solution and the capsule reattached and repaired  with Ethibond suture. 30 ml of .25% Bupivicaine injected into the capsule and into the edge of the tensor fascia lata as well as subcutaneous tissue. The fascia overlying the tensor fascia lata was  then closed with a running #1 V-Loc. Subcu was closed with interrupted  2-0 Vicryl and subcuticular running 4-0 Monocryl. Incision was cleaned  and dried. Steri-Strips and a bulky sterile dressing applied. Hemovac  drain was hooked to suction and then she was awakened and transported to  recovery in stable condition.        Please note that a surgical assistant was a medical necessity for this procedure to perform it in a safe and expeditious manner. Assistant was necessary to provide appropriate retraction of vital neurovascular structures and to prevent femoral fracture and allow for anatomic placement of the prosthesis.  Debra GrossFrank Sarahanne Woods, M.D.

## 2015-07-21 ENCOUNTER — Encounter (HOSPITAL_COMMUNITY): Payer: Self-pay | Admitting: Orthopedic Surgery

## 2015-07-21 LAB — CBC
HCT: 31 % — ABNORMAL LOW (ref 36.0–46.0)
HEMOGLOBIN: 10 g/dL — AB (ref 12.0–15.0)
MCH: 26.8 pg (ref 26.0–34.0)
MCHC: 32.3 g/dL (ref 30.0–36.0)
MCV: 83.1 fL (ref 78.0–100.0)
Platelets: 361 10*3/uL (ref 150–400)
RBC: 3.73 MIL/uL — AB (ref 3.87–5.11)
RDW: 14.6 % (ref 11.5–15.5)
WBC: 14.2 10*3/uL — ABNORMAL HIGH (ref 4.0–10.5)

## 2015-07-21 LAB — BASIC METABOLIC PANEL
Anion gap: 7 (ref 5–15)
BUN: 17 mg/dL (ref 6–20)
CALCIUM: 8.5 mg/dL — AB (ref 8.9–10.3)
CHLORIDE: 105 mmol/L (ref 101–111)
CO2: 26 mmol/L (ref 22–32)
CREATININE: 1.02 mg/dL — AB (ref 0.44–1.00)
GFR, EST NON AFRICAN AMERICAN: 59 mL/min — AB (ref >60)
Glucose, Bld: 169 mg/dL — ABNORMAL HIGH (ref 65–99)
POTASSIUM: 4.3 mmol/L (ref 3.5–5.1)
SODIUM: 138 mmol/L (ref 135–145)

## 2015-07-21 LAB — GLUCOSE, CAPILLARY
GLUCOSE-CAPILLARY: 207 mg/dL — AB (ref 65–99)
Glucose-Capillary: 150 mg/dL — ABNORMAL HIGH (ref 65–99)

## 2015-07-21 MED ORDER — OXYCODONE HCL 5 MG PO TABS: 5.0000 mg | ORAL_TABLET | ORAL | Status: AC | PRN

## 2015-07-21 MED ORDER — METHOCARBAMOL 500 MG PO TABS: 500.0000 mg | ORAL_TABLET | Freq: Four times a day (QID) | ORAL | Status: AC | PRN

## 2015-07-21 MED ORDER — RIVAROXABAN 10 MG PO TABS: 10.0000 mg | ORAL_TABLET | Freq: Every day | ORAL | Status: DC

## 2015-07-21 NOTE — Progress Notes (Signed)
Occupational Therapy Evaluation Patient Details Name: Debra Woods MRN: [id number] DOB: [birth year] Today's Date: 07/21/2015    History of Present Illness s/p RIGHT TOTAL HIP ARTHROPLASTY ANTERIOR APPROACH (Right)   Clinical Impression   All OT education completed and pt questions answered. No further OT needs at this time. Will sign off.    Follow Up Recommendations  No OT follow up;Supervision - Intermittent    Equipment Recommendations  None recommended by OT    Recommendations for Other Services       Precautions / Restrictions Precautions Precautions: None Restrictions Weight Bearing Restrictions: No Other Position/Activity Restrictions: WBAT      Mobility Bed Mobility                  Transfers                      Balance                                            ADL Overall ADL's : Needs assistance/impaired                                       General ADL Comments: Patient very familiar with ADL techniques from prior hip surgery in January. Patient requested a review of AE for LB self-care. Reviewed use of reacher, sock aide, shoe horn, long sponge, leg lifter with demonstration. Patient appreciative as she did not remember learning how to use reacher to get dressed. Observed patient ambulating with PT prior to OT session and patient doing well with mobility. She reports she felt she did not need to practice toilet and shower transfers. Verbal education of shower transfer with patient. Patient reports she has toileted with nursing staff without difficulty. No further OT needs at this time. Will sign off.     Vision     Perception     Praxis      Pertinent Vitals/Pain Pain Assessment: Faces Faces Pain Scale: Hurts a Perlstein bit Pain Location: R hip Pain Descriptors / Indicators: Sore Pain Intervention(s): Limited activity within patient's tolerance;Monitored during session     Hand Dominance      Extremity/Trunk Assessment Upper Extremity Assessment Upper Extremity Assessment: Overall WFL for tasks assessed   Lower Extremity Assessment Lower Extremity Assessment: Defer to PT evaluation   Cervical / Trunk Assessment Cervical / Trunk Assessment: Normal   Communication Communication Communication: No difficulties   Cognition Arousal/Alertness: Awake/alert Behavior During Therapy: WFL for tasks assessed/performed Overall Cognitive Status: Within Functional Limits for tasks assessed                     General Comments       Exercises       Shoulder Instructions      Home Living Family/patient expects to be discharged to:: Private residence Living Arrangements: Spouse/significant other Available Help at Discharge: Family;Available 24 hours/day Type of Home: House Home Access: Stairs to enter [employer] of Steps: 4 Entrance Stairs-Rails: Left Home Layout: Two level;1/2 bath on main level;Bed/bath upstairs Alternate Level Stairs-Number of Steps: 14   Bathroom Shower/Tub: Producer, television/film/video: Handicapped height Bathroom Accessibility: Yes How Accessible: Accessible via walker Home Equipment: Walker - 4 wheels;Bedside commode;Shower seat;Grab bars -  toilet;Adaptive equipment;Cane - single point Adaptive Equipment: Reacher;Sock aid;Long-handled shoe horn;Long-handled sponge        Prior Functioning/Environment Level of Independence: Needs assistance  Gait / Transfers Assistance Needed: ambulated with cane prior to admission ADL's / Homemaking Assistance Needed: Used AE for LB self-care        OT Diagnosis: Acute pain   OT Problem List: Decreased strength;Decreased range of motion;Decreased knowledge of use of DME or AE;Pain   OT Treatment/Interventions:      OT Goals(Current goals can be found in the care plan section) Acute Rehab OT Goals Patient Stated Goal: home today OT Goal Formulation: All assessment and education  complete, DC therapy  OT Frequency:     Barriers to D/C:            Co-evaluation              End of Session    Activity Tolerance: Patient tolerated treatment well Patient left: in chair;with call bell/phone within reach   Time: [id number] OT Time Calculation (min): 24 min Charges:  OT General Charges $OT Visit: 1 Procedure OT Evaluation $OT Eval Low Complexity: 1 Procedure OT Treatments $Self Care/Home Management : 8-22 mins G-Codes:    Debra Woods A 07/21/2015, 10:33 AM

## 2015-07-21 NOTE — Discharge Instructions (Addendum)
° °Dr. Frank [name] °Total Joint Specialist °[facility] °[address] °[place], [place] [address] °[phone] ° °ANTERIOR APPROACH TOTAL HIP REPLACEMENT POSTOPERATIVE DIRECTIONS ° ° °Hip Rehabilitation, Guidelines Following Surgery  °The results of a hip operation are greatly improved after range of motion and muscle strengthening exercises. Follow all safety measures which are given to protect your hip. If any of these exercises cause increased pain or swelling in your joint, decrease the amount until you are comfortable again. Then slowly increase the exercises. Call your caregiver if you have problems or questions.  ° °HOME CARE INSTRUCTIONS  °Remove items at home which could result in a fall. This includes throw rugs or furniture in walking pathways.  °· ICE to the affected hip every three hours for 30 minutes at a time and then as needed for pain and swelling.  Continue to use ice on the hip for pain and swelling from surgery. You may notice swelling that will progress down to the foot and ankle.  This is normal after surgery.  Elevate the leg when you are not up walking on it.   °· Continue to use the breathing machine which will help keep your temperature down.  It is common for your temperature to cycle up and down following surgery, especially at night when you are not up moving around and exerting yourself.  The breathing machine keeps your lungs expanded and your temperature down. ° ° °DIET °You may resume your previous home diet once your are discharged from the hospital. ° °DRESSING / WOUND CARE / SHOWERING °You may shower 3 days after surgery, but keep the wounds dry during showering.  You may use an occlusive plastic wrap (Press'n Seal for example), NO SOAKING/SUBMERGING IN THE BATHTUB.  If the bandage gets wet, change with a clean dry gauze.  If the incision gets wet, pat the wound dry with a clean towel. °You may start showering once you are discharged home but do not  submerge the incision under water. Just pat the incision dry and apply a dry gauze dressing on daily. °Change the surgical dressing daily and reapply a dry dressing each time. ° °ACTIVITY °Walk with your walker as instructed. °Use walker as long as suggested by your caregivers. °Avoid periods of inactivity such as sitting longer than an hour when not asleep. This helps prevent blood clots.  °You may resume a sexual relationship in one month or when given the OK by your doctor.  °You may return to work once you are cleared by your doctor.  °Do not drive a car for 6 weeks or until released by you surgeon.  °Do not drive while taking narcotics. ° °WEIGHT BEARING °Weight bearing as tolerated with assist device (walker, cane, etc) as directed, use it as long as suggested by your surgeon or therapist, typically at least 4-6 weeks. ° °POSTOPERATIVE CONSTIPATION PROTOCOL °Constipation - defined medically as fewer than three stools per week and severe constipation as less than one stool per week. ° °One of the most common issues patients have following surgery is constipation.  Even if you have a regular bowel pattern at home, your normal regimen is likely to be disrupted due to multiple reasons following surgery.  Combination of anesthesia, postoperative narcotics, change in appetite and fluid intake all can affect your bowels.  In order to avoid complications following surgery, here are some recommendations in order to help you during your recovery period. ° °Colace (docusate) - Pick up an over-the-counter   form of Colace or another stool softener and take twice a day as long as you are requiring postoperative pain medications.  Take with a full glass of water daily.  If you experience loose stools or diarrhea, hold the colace until you stool forms back up.  If your symptoms do not get better within 1 week or if they get worse, check with your doctor. ° °Dulcolax (bisacodyl) - Pick up over-the-counter and take as directed  by the product packaging as needed to assist with the movement of your bowels.  Take with a full glass of water.  Use this product as needed if not relieved by Colace only.  ° °MiraLax (polyethylene glycol) - Pick up over-the-counter to have on hand.  MiraLax is a solution that will increase the amount of water in your bowels to assist with bowel movements.  Take as directed and can mix with a glass of water, juice, soda, coffee, or tea.  Take if you go more than two days without a movement. °Do not use MiraLax more than once per day. Call your doctor if you are still constipated or irregular after using this medication for 7 days in a row. ° °If you continue to have problems with postoperative constipation, please contact the office for further assistance and recommendations.  If you experience "the worst abdominal pain ever" or develop nausea or vomiting, please contact the office immediatly for further recommendations for treatment. ° °ITCHING ° If you experience itching with your medications, try taking only a single pain pill, or even half a pain pill at a time.  You can also use Benadryl over the counter for itching or also to help with sleep.  ° °TED HOSE STOCKINGS °Wear the elastic stockings on both legs for three weeks following surgery during the day but you may remove then at night for sleeping. ° °MEDICATIONS °See your medication summary on the “After Visit Summary” that the nursing staff will review with you prior to discharge.  You may have some home medications which will be placed on hold until you complete the course of blood thinner medication.  It is important for you to complete the blood thinner medication as prescribed by your surgeon.  Continue your approved medications as instructed at time of discharge. ° °PRECAUTIONS °If you experience chest pain or shortness of breath - call 911 immediately for transfer to the hospital emergency department.  °If you develop a fever greater that 101 F,  purulent drainage from wound, increased redness or drainage from wound, foul odor from the wound/dressing, or calf pain - CONTACT YOUR SURGEON.   °                                                °FOLLOW-UP APPOINTMENTS °Make sure you keep all of your appointments after your operation with your surgeon and caregivers. You should call the office at the above phone number and make an appointment for approximately two weeks after the date of your surgery or on the date instructed by your surgeon outlined in the "After Visit Summary". ° °RANGE OF MOTION AND STRENGTHENING EXERCISES  °These exercises are designed to help you keep full movement of your hip joint. Follow your caregiver's or physical therapist's instructions. Perform all exercises about fifteen times, three times per day or as directed. Exercise both hips, even if you   have had only one joint replacement. These exercises can be done on a training (exercise) mat, on the floor, on a table or on a bed. Use whatever works the best and is most comfortable for you. Use music or television while you are exercising so that the exercises are a pleasant break in your day. This will make your life better with the exercises acting as a break in routine you can look forward to.  °Lying on your back, slowly slide your foot toward your buttocks, raising your knee up off the floor. Then slowly slide your foot back down until your leg is straight again.  °Lying on your back spread your legs as far apart as you can without causing discomfort.  °Lying on your side, raise your upper leg and foot straight up from the floor as far as is comfortable. Slowly lower the leg and repeat.  °Lying on your back, tighten up the muscle in the front of your thigh (quadriceps muscles). You can do this by keeping your leg straight and trying to raise your heel off the floor. This helps strengthen the largest muscle supporting your knee.  °Lying on your back, tighten up the muscles of your  buttocks both with the legs straight and with the knee bent at a comfortable angle while keeping your heel on the floor.  ° °IF YOU ARE TRANSFERRED TO A SKILLED REHAB FACILITY °If the patient is transferred to a skilled rehab facility following release from the hospital, a list of the current medications will be sent to the facility for the patient to continue.  When discharged from the skilled rehab facility, please have the facility set up the patient's Home Health Physical Therapy prior to being released. Also, the skilled facility will be responsible for providing the patient with their medications at time of release from the facility to include their pain medication, the muscle relaxants, and their blood thinner medication. If the patient is still at the rehab facility at time of the two week follow up appointment, the skilled rehab facility will also need to assist the patient in arranging follow up appointment in our office and any transportation needs. ° °MAKE SURE YOU:  °Understand these instructions.  °Get help right away if you are not doing well or get worse.  ° ° °Pick up stool softner and laxative for home use following surgery while on pain medications. °Do not submerge incision under water. °Please use good hand washing techniques while changing dressing each day. °May shower starting three days after surgery. °Please use a clean towel to pat the incision dry following showers. °Continue to use ice for pain and swelling after surgery. °Do not use any lotions or creams on the incision until instructed by your surgeon. ° °Take Xarelto for two and a half more weeks, then discontinue Xarelto. °Once the patient has completed the Xarelto, they may resume the 81 mg Aspirin. ° °Information on my medicine - XARELTO® (Rivaroxaban) ° °This medication education was reviewed with me or my healthcare representative as part of my discharge preparation.  The pharmacist that spoke with me during my hospital stay was:   Stephani Janak George, RPH ° °Why was Xarelto® prescribed for you? °Xarelto® was prescribed for you to reduce the risk of blood clots forming after orthopedic surgery. The medical term for these abnormal blood clots is venous thromboembolism (VTE). ° °What do you need to know about xarelto® ? °Take your Xarelto® ONCE DAILY at the same time every day. °  You may take it either with or without food. ° °If you have difficulty swallowing the tablet whole, you may crush it and mix in applesauce just prior to taking your dose. ° °Take Xarelto® exactly as prescribed by your doctor and DO NOT stop taking Xarelto® without talking to the doctor who prescribed the medication.  Stopping without other VTE prevention medication to take the place of Xarelto® may increase your risk of developing a clot. ° °After discharge, you should have regular check-up appointments with your healthcare provider that is prescribing your Xarelto®.   ° °What do you do if you miss a dose? °If you miss a dose, take it as soon as you remember on the same day then continue your regularly scheduled once daily regimen the next day. Do not take two doses of Xarelto® on the same day.  ° °Important Safety Information °A possible side effect of Xarelto® is bleeding. You should call your healthcare provider right away if you experience any of the following: °? Bleeding from an injury or your nose that does not stop. °? Unusual colored urine (red or dark brown) or unusual colored stools (red or black). °? Unusual bruising for unknown reasons. °? A serious fall or if you hit your head (even if there is no bleeding). ° °Some medicines may interact with Xarelto® and might increase your risk of bleeding while on Xarelto®. To help avoid this, consult your healthcare provider or pharmacist prior to using any new prescription or non-prescription medications, including herbals, vitamins, non-steroidal anti-inflammatory drugs (NSAIDs) and supplements. ° °This website  has more information on Xarelto®: www.xarelto.com. ° ° ° ° °

## 2015-07-21 NOTE — Care Management Note (Addendum)
Case Management Note  Patient Details  Name: Debra Woods MRN: [id number] Date of Birth: [birth year]  Subjective/Objective:                  RIGHT TOTAL HIP ARTHROPLASTY ANTERIOR APPROACH (Right) Action/Plan: Discharge planning Expected Discharge Date:  07/21/15               Expected Discharge Plan:  [place]  In-House Referral:     Discharge planning Services  CM Consult  Post Acute Care Choice:    Choice offered to:  Patient  DME Arranged:  Walker rolling DME Agency:  [place]:  PT Page Agency:  [facility] (now [facility])  Status of Service:  Completed, signed off  If discussed at [employer] of Stay Meetings, dates discussed:    Additional Comments: PT approached CM requesting a swap for the youth rolling walker which was delivered by [facility] for a standard rolling walker.  CM called [facility] rep, Germaine to please deliver the rolling walker to room so pt can discharge.  No other CM needs were communicated. CM met with pt in room to offer choice of home health agency.  Pt chooses [facility].  Referral called to [employer], Tim.  Cm called [facility] DME rep, Germaine to please deliver the rolling walker to room prior to discharge.  NO other CM needs were communicated. Dellie Catholic, RN 07/21/2015, 11:12 AM

## 2015-07-21 NOTE — Progress Notes (Signed)
   Subjective: 1 Day Post-Op Procedure(s) (LRB): RIGHT TOTAL HIP ARTHROPLASTY ANTERIOR APPROACH (Right) Patient reports pain as mild.   Patient seen in rounds by Dr. Lequita HaltAluisio. Patient is well, and has had no acute complaints or problems We will start therapy today.  If they do well with therapy and meets all goals, then will allow home later this afternoon following therapy. Plan is to go Home after hospital stay.  Objective: Vital signs in last 24 hours: Temp:  [97 F (36.1 C)-98 F (36.7 C)] 97.4 F (36.3 C) (07/20 0549) Pulse Rate:  [64-91] 68 (07/20 0549) Resp:  [12-21] 16 (07/20 0549) BP: (98-129)/(58-76) 112/58 mmHg (07/20 0549) SpO2:  [93 %-100 %] 96 % (07/20 0549) Weight:  [73.029 kg (161 lb)] 73.029 kg (161 lb) (07/19 1540)  Intake/Output from previous day:  Intake/Output Summary (Last 24 hours) at 07/21/15 0748 Last data filed at 07/21/15 0648  Gross per 24 hour  Intake   3170 ml  Output   2390 ml  Net    780 ml    Labs:  Recent Labs  07/21/15 0448  HGB 10.0*    Recent Labs  07/21/15 0448  WBC 14.2*  RBC 3.73*  HCT 31.0*  PLT 361    Recent Labs  07/21/15 0448  NA 138  K 4.3  CL 105  CO2 26  BUN 17  CREATININE 1.02*  GLUCOSE 169*  CALCIUM 8.5*   No results for input(s): LABPT, INR in the last 72 hours.  EXAM General - Patient is Alert, Appropriate and Oriented Extremity - Neurovascular intact Sensation intact distally Dorsiflexion/Plantar flexion intact Dressing - dressing C/D/I Motor Function - intact, moving foot and toes well on exam.  Hemovac pulled without difficulty.  Past Medical History  Diagnosis Date  . GERD (gastroesophageal reflux disease)   . Hypertension   . Depression   . Complication of anesthesia     cpr was done, pt quit breathing 06/04/01 code note states: patient on Morphine PCA and IV toradol post hysterectomy, found apneic and responded to Narcan  . Hyperlipidemia   . Type II diabetes mellitus (HCC)   .  Arthritis     "hips" (01/28/2015)  . Heart murmur     in childhood  . Dry eyes   . Wears glasses   . Anxiety     occas  . Wears partial dentures     upper  . Urinary incontinence     ocassionally   . Swelling of both ankles     Assessment/Plan: 1 Day Post-Op Procedure(s) (LRB): RIGHT TOTAL HIP ARTHROPLASTY ANTERIOR APPROACH (Right) Principal Problem:   OA (osteoarthritis) of hip  Estimated body mass index is 29.44 kg/(m^2) as calculated from the following:   Height as of this encounter: 5\' 2"  (1.575 m).   Weight as of this encounter: 73.029 kg (161 lb). Advance diet Up with therapy Discharge home with home health  DVT Prophylaxis - Xarelto Weight Bearing As Tolerated right Leg Hemovac Pulled Begin Therapy  If meets goals and able to go home: Discharge home with home health Diet - Cardiac diet and Diabetic diet Follow up - in 2 weeks Activity - WBAT Disposition - Home Condition Upon Discharge - home if improved D/C Meds - See DC Summary DVT Prophylaxis - Xarelto  Avel Peacerew Perkins, PA-C Orthopaedic Surgery 07/21/2015, 7:48 AM

## 2015-07-21 NOTE — Discharge Summary (Signed)
Physician Discharge Summary   Patient ID: SHAJUAN MUSSO MRN: [id number] DOB/AGE: April 04, 1955 60 y.o.  Admit date: 07/20/2015 Discharge date: 07/21/2015  Primary Diagnosis:  Osteoarthritis of the Right hip.   Admission Diagnoses:  Past Medical History  Diagnosis Date  . GERD (gastroesophageal reflux disease)   . Hypertension   . Depression   . Complication of anesthesia     cpr was done, pt quit breathing 06/04/01 code note states: patient on Morphine PCA and IV toradol post hysterectomy, found apneic and responded to Narcan  . Hyperlipidemia   . Type II diabetes mellitus ([place])   . Arthritis     "hips" (01/28/2015)  . Heart murmur     in childhood  . Dry eyes   . Wears glasses   . Anxiety     occas  . Wears partial dentures     upper  . Urinary incontinence     ocassionally   . Swelling of both ankles    Discharge Diagnoses:   Principal Problem:   OA (osteoarthritis) of hip  Estimated body mass index is 29.44 kg/(m^2) as calculated from the following:   Height as of this encounter: '5\' 2"'$  (1.575 m).   Weight as of this encounter: 73.029 kg (161 lb).  Procedure(s) (LRB): RIGHT TOTAL HIP ARTHROPLASTY ANTERIOR APPROACH (Right)   Consults: None  HPI: Debra Woods is a 60 y.o. female who has advanced end-  stage arthritis of her Right hip with progressively worsening pain and  dysfunction.The patient has failed nonoperative management and presents for  total hip arthroplasty.  Laboratory Data: Admission on 07/20/2015  Component Date Value Ref Range Status  . Glucose-Capillary 07/20/2015 140* 65 - 99 mg/dL Final  . WBC 07/21/2015 14.2* 4.0 - 10.5 K/uL Final  . RBC 07/21/2015 3.73* 3.87 - 5.11 MIL/uL Final  . Hemoglobin 07/21/2015 10.0* 12.0 - 15.0 g/dL Final  . HCT 07/21/2015 31.0* 36.0 - 46.0 % Final  . MCV 07/21/2015 83.1  78.0 - 100.0 fL Final  . MCH 07/21/2015 26.8  26.0 - 34.0 pg Final  . MCHC 07/21/2015 32.3  30.0 - 36.0 g/dL Final  . RDW 07/21/2015  14.6  11.5 - 15.5 % Final  . Platelets 07/21/2015 361  150 - 400 K/uL Final  . Sodium 07/21/2015 138  135 - 145 mmol/L Final  . Potassium 07/21/2015 4.3  3.5 - 5.1 mmol/L Final  . Chloride 07/21/2015 105  101 - 111 mmol/L Final  . CO2 07/21/2015 26  22 - 32 mmol/L Final  . Glucose, Bld 07/21/2015 169* 65 - 99 mg/dL Final  . BUN 07/21/2015 17  6 - 20 mg/dL Final  . Creatinine, Ser 07/21/2015 1.02* 0.44 - 1.00 mg/dL Final  . Calcium 07/21/2015 8.5* 8.9 - 10.3 mg/dL Final  . GFR calc non Af Amer 07/21/2015 59* >60 mL/min Final  . GFR calc Af Amer 07/21/2015 >60  >60 mL/min Final   Comment: (NOTE) The eGFR has been calculated using the CKD EPI equation. This calculation has not been validated in all clinical situations. eGFR's persistently <60 mL/min signify possible Chronic Kidney Disease.   . Anion gap 07/21/2015 7  5 - 15 Final  . Glucose-Capillary 07/20/2015 211* 65 - 99 mg/dL Final  . Glucose-Capillary 07/20/2015 270* 65 - 99 mg/dL Final  . Glucose-Capillary 07/21/2015 150* 65 - 99 mg/dL Final  Hospital Outpatient Visit on 07/13/2015  Component Date Value Ref Range Status  . aPTT 07/13/2015 27  24 - 37 seconds Final  .  WBC 07/13/2015 12.1* 4.0 - 10.5 K/uL Final  . RBC 07/13/2015 4.55  3.87 - 5.11 MIL/uL Final  . Hemoglobin 07/13/2015 12.0  12.0 - 15.0 g/dL Final  . HCT 07/13/2015 38.0  36.0 - 46.0 % Final  . MCV 07/13/2015 83.5  78.0 - 100.0 fL Final  . MCH 07/13/2015 26.4  26.0 - 34.0 pg Final  . MCHC 07/13/2015 31.6  30.0 - 36.0 g/dL Final  . RDW 07/13/2015 14.7  11.5 - 15.5 % Final  . Platelets 07/13/2015 373  150 - 400 K/uL Final  . Sodium 07/13/2015 135  135 - 145 mmol/L Final  . Potassium 07/13/2015 4.7  3.5 - 5.1 mmol/L Final  . Chloride 07/13/2015 100* 101 - 111 mmol/L Final  . CO2 07/13/2015 27  22 - 32 mmol/L Final  . Glucose, Bld 07/13/2015 189* 65 - 99 mg/dL Final  . BUN 07/13/2015 16  6 - 20 mg/dL Final  . Creatinine, Ser 07/13/2015 0.88  0.44 - 1.00 mg/dL Final    . Calcium 07/13/2015 9.5  8.9 - 10.3 mg/dL Final  . Total Protein 07/13/2015 7.0  6.5 - 8.1 g/dL Final  . Albumin 07/13/2015 4.2  3.5 - 5.0 g/dL Final  . AST 07/13/2015 19  15 - 41 U/L Final  . ALT 07/13/2015 17  14 - 54 U/L Final  . Alkaline Phosphatase 07/13/2015 93  38 - 126 U/L Final  . Total Bilirubin 07/13/2015 0.8  0.3 - 1.2 mg/dL Final  . GFR calc non Af Amer 07/13/2015 >60  >60 mL/min Final  . GFR calc Af Amer 07/13/2015 >60  >60 mL/min Final   Comment: (NOTE) The eGFR has been calculated using the CKD EPI equation. This calculation has not been validated in all clinical situations. eGFR's persistently <60 mL/min signify possible Chronic Kidney Disease.   . Anion gap 07/13/2015 8  5 - 15 Final  . Prothrombin Time 07/13/2015 12.9  11.6 - 15.2 seconds Final  . INR 07/13/2015 0.99  0.00 - 1.49 Final  . ABO/RH(D) 07/13/2015 O POS   Final  . Antibody Screen 07/13/2015 NEG   Final  . Sample Expiration 07/13/2015 07/23/2015   Final  . Extend sample reason 07/13/2015 NO TRANSFUSIONS OR PREGNANCY IN THE PAST 3 MONTHS   Final  . Color, Urine 07/13/2015 YELLOW  YELLOW Final  . APPearance 07/13/2015 CLEAR  CLEAR Final  . Specific Gravity, Urine 07/13/2015 1.006  1.005 - 1.030 Final  . pH 07/13/2015 5.5  5.0 - 8.0 Final  . Glucose, UA 07/13/2015 NEGATIVE  NEGATIVE mg/dL Final  . Hgb urine dipstick 07/13/2015 NEGATIVE  NEGATIVE Final  . Bilirubin Urine 07/13/2015 NEGATIVE  NEGATIVE Final  . Ketones, ur 07/13/2015 NEGATIVE  NEGATIVE mg/dL Final  . Protein, ur 07/13/2015 NEGATIVE  NEGATIVE mg/dL Final  . Nitrite 07/13/2015 NEGATIVE  NEGATIVE Final  . Leukocytes, UA 07/13/2015 NEGATIVE  NEGATIVE Final   MICROSCOPIC NOT DONE ON URINES WITH NEGATIVE PROTEIN, BLOOD, LEUKOCYTES, NITRITE, OR GLUCOSE <1000 mg/dL.  Marland Kitchen MRSA, PCR 07/13/2015 NEGATIVE  NEGATIVE Final  . Staphylococcus aureus 07/13/2015 NEGATIVE  NEGATIVE Final   Comment:        The Xpert SA Assay (FDA approved for NASAL  specimens in patients over 48 years of age), is one component of a comprehensive surveillance program.  Test performance has been validated by [facility] for patients greater than or equal to 77 year old. It is not intended to diagnose infection nor to guide or monitor treatment.   Marland Kitchen  Hgb A1c MFr Bld 07/13/2015 7.2* 4.8 - 5.6 % Final   Comment: (NOTE)         Pre-diabetes: 5.7 - 6.4         Diabetes: >6.4         Glycemic control for adults with diabetes: <7.0   . Mean Plasma Glucose 07/13/2015 160   Final   Comment: (NOTE) Performed At: [facility] [place], [place] [id number] Lindon Romp MD [id number]   . ABO/RH(D) 07/13/2015 O POS   Final  Admission on 06/26/2015, Discharged on 06/26/2015  Component Date Value Ref Range Status  . Sodium 06/26/2015 138  135 - 145 mmol/L Final  . Potassium 06/26/2015 4.5  3.5 - 5.1 mmol/L Final  . Chloride 06/26/2015 101  101 - 111 mmol/L Final  . CO2 06/26/2015 28  22 - 32 mmol/L Final  . Glucose, Bld 06/26/2015 191* 65 - 99 mg/dL Final  . BUN 06/26/2015 21* 6 - 20 mg/dL Final  . Creatinine, Ser 06/26/2015 1.46* 0.44 - 1.00 mg/dL Final  . Calcium 06/26/2015 10.3  8.9 - 10.3 mg/dL Final  . GFR calc non Af Amer 06/26/2015 39* >60 mL/min Final  . GFR calc Af Amer 06/26/2015 45* >60 mL/min Final   Comment: (NOTE) The eGFR has been calculated using the CKD EPI equation. This calculation has not been validated in all clinical situations. eGFR's persistently <60 mL/min signify possible Chronic Kidney Disease.   . Anion gap 06/26/2015 9  5 - 15 Final  . WBC 06/26/2015 14.8* 4.0 - 10.5 K/uL Final  . RBC 06/26/2015 4.85  3.87 - 5.11 MIL/uL Final  . Hemoglobin 06/26/2015 12.8  12.0 - 15.0 g/dL Final  . HCT 06/26/2015 40.0  36.0 - 46.0 % Final  . MCV 06/26/2015 82.5  78.0 - 100.0 fL Final  . MCH 06/26/2015 26.4  26.0 - 34.0 pg Final  . MCHC 06/26/2015 32.0  30.0 - 36.0 g/dL Final  . RDW 06/26/2015 14.5  11.5 -  15.5 % Final  . Platelets 06/26/2015 437* 150 - 400 K/uL Final  . Troponin i, poc 06/26/2015 0.00  0.00 - 0.08 ng/mL Final  . Comment 3 06/26/2015          Final   Comment: Due to the release kinetics of cTnI, a negative result within the first hours of the onset of symptoms does not rule out myocardial infarction with certainty. If myocardial infarction is still suspected, repeat the test at appropriate intervals.   . Lipase 06/26/2015 27  11 - 51 U/L Final     X-Rays:Dg Chest 2 View  06/26/2015  CLINICAL DATA:  60 year old female with double mid sternal chest pain EXAM: CHEST  2 VIEW COMPARISON:  None. FINDINGS: The heart size and mediastinal contours are within normal limits. Both lungs are clear. The visualized skeletal structures are unremarkable. IMPRESSION: No active cardiopulmonary disease. Electronically Signed   By: Anner Crete M.D.   On: 06/26/2015 18:34   Dg Pelvis Portable  07/20/2015  CLINICAL DATA:  Right hip arthroplasty EXAM: PORTABLE PELVIS 1-2 VIEWS COMPARISON:  None. FINDINGS: Interval total right hip arthroplasty without failure complication. Postsurgical changes in the surrounding soft tissues with a surgical drain present in the surgical bed. Prior left hip arthroplasty without failure or complication. No acute fracture or dislocation. IMPRESSION: Interval right total hip arthroplasty. Electronically Signed   By: Kathreen Devoid   On: 07/20/2015 15:11   Dg C-arm 1-60 Min-no Report  07/20/2015  CLINICAL DATA: surgery C-ARM 1-60 MINUTES Fluoroscopy was utilized by the requesting physician.  No radiographic interpretation.    EKG: Orders placed or performed during the hospital encounter of 06/26/15  . ED EKG within 10 minutes  . ED EKG within 10 minutes  . ED EKG  . ED EKG  . EKG     Hospital Course: Patient was admitted to [facility] and taken to the OR and underwent the above state procedure without complications.  Patient tolerated the procedure  well and was later transferred to the recovery room and then to the orthopaedic floor for postoperative care.  They were given PO and IV analgesics for pain control following their surgery.  They were given 24 hours of postoperative antibiotics of  Anti-infectives    Start     Dose/Rate Route Frequency Ordered Stop   07/20/15 1930  ceFAZolin (ANCEF) IVPB 2g/100 mL premix     2 g 200 mL/hr over 30 Minutes Intravenous Every 6 hours 07/20/15 1551 07/21/15 0107   07/20/15 1126  ceFAZolin (ANCEF) IVPB 2g/100 mL premix     2 g 200 mL/hr over 30 Minutes Intravenous On call to O.R. 07/20/15 1126 07/20/15 1300     and started on DVT prophylaxis in the form of Xarelto.   PT and OT were ordered for total hip protocol.  The patient was allowed to be WBAT with therapy. Discharge planning was consulted to help with postop disposition and equipment needs.  Patient had a good night on the evening of surgery.  They started to get up OOB with therapy on day one.  Hemovac drain was pulled without difficulty.  Dressing was checked and the dressing was clean and dry. Patient was seen in rounds and it was felt that as long as they did well with therapy that they would be ready to go home later that same day.  Discharge home with home health Diet - Cardiac diet and Diabetic diet Follow up - in 2 weeks Activity - WBAT Disposition - Home Condition Upon Discharge - home if improved D/C Meds - See DC Summary DVT Prophylaxis - Xarelto  Discharge Instructions    Call MD / Call 911    Complete by:  As directed   If you experience chest pain or shortness of breath, CALL 911 and be transported to the hospital emergency room.  If you develope a fever above 101 F, pus (white drainage) or increased drainage or redness at the wound, or calf pain, call your surgeon's office.     Change dressing    Complete by:  As directed   You may change your dressing dressing daily with sterile 4 x 4 inch gauze dressing and paper tape.  Do  not submerge the incision under water.     Constipation Prevention    Complete by:  As directed   Drink plenty of fluids.  Prune juice may be helpful.  You may use a stool softener, such as Colace (over the counter) 100 mg twice a day.  Use MiraLax (over the counter) for constipation as needed.     Diet - low sodium heart healthy    Complete by:  As directed      Diet Carb Modified    Complete by:  As directed      Discharge instructions    Complete by:  As directed   Pick up stool softner and laxative for home use following surgery while on pain medications. Do not submerge incision  under water. Please use good hand washing techniques while changing dressing each day. May shower starting three days after surgery. Please use a clean towel to pat the incision dry following showers. Continue to use ice for pain and swelling after surgery. Do not use any lotions or creams on the incision until instructed by your surgeon.  Total Hip Protocol.  Take Xarelto for two and a half more weeks, then discontinue Xarelto. Once the patient has completed the Xarelto, they may resume the 81 mg Aspirin.  Postoperative Constipation Protocol  Constipation - defined medically as fewer than three stools per week and severe constipation as less than one stool per week.  One of the most common issues patients have following surgery is constipation.  Even if you have a regular bowel pattern at home, your normal regimen is likely to be disrupted due to multiple reasons following surgery.  Combination of anesthesia, postoperative narcotics, change in appetite and fluid intake all can affect your bowels.  In order to avoid complications following surgery, here are some recommendations in order to help you during your recovery period.  Colace (docusate) - Pick up an over-the-counter form of Colace or another stool softener and take twice a day as long as you are requiring postoperative pain medications.  Take with a  full glass of water daily.  If you experience loose stools or diarrhea, hold the colace until you stool forms back up.  If your symptoms do not get better within 1 week or if they get worse, check with your doctor.  Dulcolax (bisacodyl) - Pick up over-the-counter and take as directed by the product packaging as needed to assist with the movement of your bowels.  Take with a full glass of water.  Use this product as needed if not relieved by Colace only.   MiraLax (polyethylene glycol) - Pick up over-the-counter to have on hand.  MiraLax is a solution that will increase the amount of water in your bowels to assist with bowel movements.  Take as directed and can mix with a glass of water, juice, soda, coffee, or tea.  Take if you go more than two days without a movement. Do not use MiraLax more than once per day. Call your doctor if you are still constipated or irregular after using this medication for 7 days in a row.  If you continue to have problems with postoperative constipation, please contact the office for further assistance and recommendations.  If you experience "the worst abdominal pain ever" or develop nausea or vomiting, please contact the office immediatly for further recommendations for treatment.     Do not sit on low chairs, stoools or toilet seats, as it may be difficult to get up from low surfaces    Complete by:  As directed      Driving restrictions    Complete by:  As directed   No driving until released by the physician.     Increase activity slowly as tolerated    Complete by:  As directed      Lifting restrictions    Complete by:  As directed   No lifting until released by the physician.     Patient may shower    Complete by:  As directed   You may shower without a dressing once there is no drainage.  Do not wash over the wound.  If drainage remains, do not shower until drainage stops.     TED hose    Complete by:  As directed   Use stockings (TED hose) for 3 weeks on  both leg(s).  You may remove them at night for sleeping.     Weight bearing as tolerated    Complete by:  As directed   Laterality:  right  Extremity:  Lower            Medication List    STOP taking these medications        aspirin 81 MG chewable tablet     ciprofloxacin 500 MG tablet  Commonly known as:  CIPRO     Fish Oil 1000 MG Caps     LORazepam 0.5 MG tablet  Commonly known as:  ATIVAN     naproxen sodium 220 MG tablet  Commonly known as:  ANAPROX     ondansetron 4 MG tablet  Commonly known as:  ZOFRAN      TAKE these medications        atorvastatin 20 MG tablet  Commonly known as:  LIPITOR  Take 20 mg by mouth daily.     buPROPion 300 MG 24 hr tablet  Commonly known as:  WELLBUTRIN XL  Take 300 mg by mouth every morning. Reported on 01/14/2015     furosemide 20 MG tablet  Commonly known as:  LASIX  Take 20 mg by mouth daily as needed for fluid.     lisinopril-hydrochlorothiazide 10-12.5 MG tablet  Commonly known as:  PRINZIDE,ZESTORETIC  Take 1 tablet by mouth daily.     metFORMIN 850 MG tablet  Commonly known as:  GLUCOPHAGE  Take 850 mg by mouth 2 (two) times daily.     methocarbamol 500 MG tablet  Commonly known as:  ROBAXIN  Take 1 tablet (500 mg total) by mouth every 6 (six) hours as needed for muscle spasms.     oxyCODONE 5 MG immediate release tablet  Commonly known as:  Oxy IR/ROXICODONE  Take 1-2 tablets (5-10 mg total) by mouth every 3 (three) hours as needed for moderate pain or severe pain.     pantoprazole 40 MG tablet  Commonly known as:  PROTONIX  Take 40 mg by mouth daily.     rivaroxaban 10 MG Tabs tablet  Commonly known as:  XARELTO  Take 1 tablet (10 mg total) by mouth daily with breakfast. Take Xarelto for two and a half more weeks, then discontinue Xarelto. Once the patient has completed the Xarelto, they may resume the 81 mg Aspirin.     zolpidem 10 MG tablet  Commonly known as:  AMBIEN  Take 5 mg by mouth at bedtime  as needed for sleep.           Follow-up Information    Follow up with Gearlean Alf, MD. Schedule an appointment as soon as possible for a visit on 08/02/2015.   Specialty:  Orthopedic Surgery   Why:  Call for appointment for Tuesday 08/02/2015 with Dr. Wynelle Link.   Contact information:   [address] [place] [address] [phone]       Signed: Arlee Muslim, PA-C Orthopaedic Surgery 07/21/2015, 7:59 AM

## 2015-07-21 NOTE — Evaluation (Signed)
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  Pt should progress well to dc home with family assist and HHPT follow up.    Follow Up Recommendations Home health PT    Equipment Recommendations  Rolling walker with 5" wheels    Recommendations for Other Services OT consult     Precautions / Restrictions Precautions Precautions: Fall Restrictions Weight Bearing Restrictions: No Other Position/Activity Restrictions: WBAT      Mobility  Bed Mobility Overal bed mobility: Needs Assistance Bed Mobility: Supine to Sit     Supine to sit: Min assist     General bed mobility comments: cues for sequence and use of L LE to self assist  Transfers Overall transfer level: Needs assistance Equipment used: Rolling walker (2 wheeled) Transfers: Sit to/from Stand Sit to Stand: Min assist;Min guard         General transfer comment: cues for LE management and use of UEs to self assist  Ambulation/Gait Ambulation/Gait assistance: Min assist;Min guard Ambulation Distance (Feet): 180 Feet Assistive device: Rolling walker (2 wheeled) Gait Pattern/deviations: Step-to pattern;Step-through pattern;Decreased step length - right;Decreased step length - left;Shuffle;Trunk flexed Gait velocity: decr Gait velocity interpretation: Below normal speed for age/gender General Gait Details: cues for sequence, posture and position from [employer]  Stairs            Wheelchair Mobility    Modified Rankin (Stroke Patients Only)       Balance                                             Pertinent Vitals/Pain Pain Assessment: 0-10 Pain Score: 3  Faces Pain Scale: Hurts  a Burr bit Pain Location: R hip Pain Descriptors / Indicators: Aching;Sore Pain Intervention(s): Limited activity within patient's tolerance;Monitored during session;Premedicated before session;Ice applied    Home Living Family/patient expects to be discharged to:: Private residence Living Arrangements: Spouse/significant other Available Help at Discharge: Family;Available 24 hours/day Type of Home: House Home Access: Stairs to enter Entrance Stairs-Rails: Left Entrance Stairs-Number of Steps: 4 Home Layout: Two level;1/2 bath on main level;Bed/bath upstairs Home Equipment: Walker - 4 wheels;Bedside commode;Shower seat;Grab bars - toilet;Adaptive equipment;Cane - single point      Prior Function Level of Independence: Needs assistance   Gait / Transfers Assistance Needed: ambulated with cane prior to admission  ADL's / Homemaking Assistance Needed: Used AE for LB self-care        Hand Dominance   Dominant Hand: Right    Extremity/Trunk Assessment   Upper Extremity Assessment: Overall WFL for tasks assessed           Lower Extremity Assessment: RLE deficits/detail RLE Deficits / Details: Strength at hip 2+/5 wtih AAROM at hip to 80 flex and 15 abd    Cervical / Trunk Assessment: Normal  Communication   Communication: No difficulties  Cognition Arousal/Alertness: Awake/alert Behavior During Therapy: WFL for tasks assessed/performed Overall Cognitive Status: Within Functional Limits for tasks assessed                      General Comments  Exercises Total Joint Exercises Ankle Circles/Pumps: AROM;Both;15 reps;Supine Quad Sets: AROM;Both;10 reps;Supine Heel Slides: AAROM;Right;20 reps;Supine Hip ABduction/ADduction: AAROM;Right;15 reps;Supine      Assessment/Plan    PT Assessment Patient needs continued PT services  PT Diagnosis Difficulty walking   PT Problem List Decreased strength;Decreased range of motion;Decreased activity  tolerance;Decreased mobility;Pain;Decreased knowledge of use of DME  PT Treatment Interventions DME instruction;Gait training;Stair training;Functional mobility training;Therapeutic activities;Therapeutic exercise;Patient/family education   PT Goals (Current goals can be found in the Care Plan section) Acute Rehab PT Goals Patient Stated Goal: home today PT Goal Formulation: With patient Time For Goal Achievement: 07/23/15 Potential to Achieve Goals: Good    Frequency 7X/week   Barriers to discharge        Co-evaluation               End of Session Equipment Utilized During Treatment: Gait belt Activity Tolerance: Patient tolerated treatment well Patient left: in chair Nurse Communication: Mobility status         Time: 0822-0911 PT Time Calculation (min) (ACUTE ONLY): 49 min   Charges:   PT Evaluation $PT Eval Low Complexity: 1 Procedure PT Treatments $Gait Training: 8-22 mins $Therapeutic Exercise: 8-22 mins   PT G Codes:        Jamaiya Tunnell Aug 06, 2015, 12:06 PM

## 2015-07-21 NOTE — Progress Notes (Signed)
Physical Therapy Treatment Patient Details Name: Debra DakinBrenda B Woods MRN: [id number] DOB: 1955/09/28 Today's Date: 07/21/2015    History of Present Illness s/p RIGHT TOTAL HIP ARTHROPLASTY ANTERIOR APPROACH (Right);  Hx of L THR 1/17    PT Comments    Reviewed car transfers and stairs with pt and spouse.  Follow Up Recommendations  Home health PT     Equipment Recommendations  Rolling walker with 5" wheels    Recommendations for Other Services OT consult     Precautions / Restrictions Precautions Precautions: Fall Restrictions Weight Bearing Restrictions: No Other Position/Activity Restrictions: WBAT    Mobility  Bed Mobility               General bed mobility comments: Pt OOB and declines back to bed  Transfers Overall transfer level: Needs assistance Equipment used: Rolling walker (2 wheeled) Transfers: Sit to/from Stand Sit to Stand: Min guard;Supervision         General transfer comment: cues for LE management and use of UEs to self assist  Ambulation/Gait Ambulation/Gait assistance: Min guard;Supervision Ambulation Distance (Feet): 150 Feet Assistive device: Rolling walker (2 wheeled) Gait Pattern/deviations: Step-to pattern;Step-through pattern;Decreased step length - right;Decreased step length - left;Shuffle;Trunk flexed Gait velocity: decr   General Gait Details: cues for sequence, posture and position from RW   Stairs Stairs: Yes Stairs assistance: Min assist Stair Management: No rails;One rail Right;Step to pattern;Forwards;With cane Number of Stairs: 10 General stair comments: 5 stairs with rail and cane and 5 steps with cane and HHA.  Cues for sequence and foot/cane placement.  Spouse arrived later and reviewed same with him.  Wheelchair Mobility    Modified Rankin (Stroke Patients Only)       Balance                                    Cognition Arousal/Alertness: Awake/alert Behavior During Therapy: WFL for tasks  assessed/performed Overall Cognitive Status: Within Functional Limits for tasks assessed                      Exercises Total Joint Exercises Ankle Circles/Pumps: AROM;Both;15 reps;Supine    General Comments        Pertinent Vitals/Pain Pain Assessment: 0-10 Pain Score: 3  Pain Location: R hip Pain Descriptors / Indicators: Aching Pain Intervention(s): Limited activity within patient's tolerance;Monitored during session;Premedicated before session    Home Living                      Prior Function            PT Goals (current goals can now be found in the care plan section) Acute Rehab PT Goals Patient Stated Goal: home today PT Goal Formulation: With patient Time For Goal Achievement: 07/23/15 Potential to Achieve Goals: Good Progress towards PT goals: Progressing toward goals    Frequency  7X/week    PT Plan Current plan remains appropriate    Co-evaluation             End of Session Equipment Utilized During Treatment: Gait belt Activity Tolerance: Patient tolerated treatment well Patient left: in chair     Time: [id number] PT Time Calculation (min) (ACUTE ONLY): 44 min  Charges:  $Gait Training: 8-22 mins $Therapeutic Activity: 8-22 mins                    G  Codes:      Iain Sawchuk 07/21/2015, 4:38 PM

## 2017-01-12 IMAGING — RF DG HIP (WITH PELVIS) OPERATIVE*L*
1 series · 2 of 2 positions shown · non-contrast
Comparison: None.

CLINICAL DATA: Status post left total hip arthroplasty.

EXAM:
OPERATIVE left HIP (WITH PELVIS IF PERFORMED) 2 VIEWS
TECHNIQUE: Fluoroscopic spot image(s) were submitted for interpretation
post-operatively.
FLUOROSCOPY TIME:  8 seconds.

[Series 1: run · 2 of 2 slices shown]
[im 1/2]
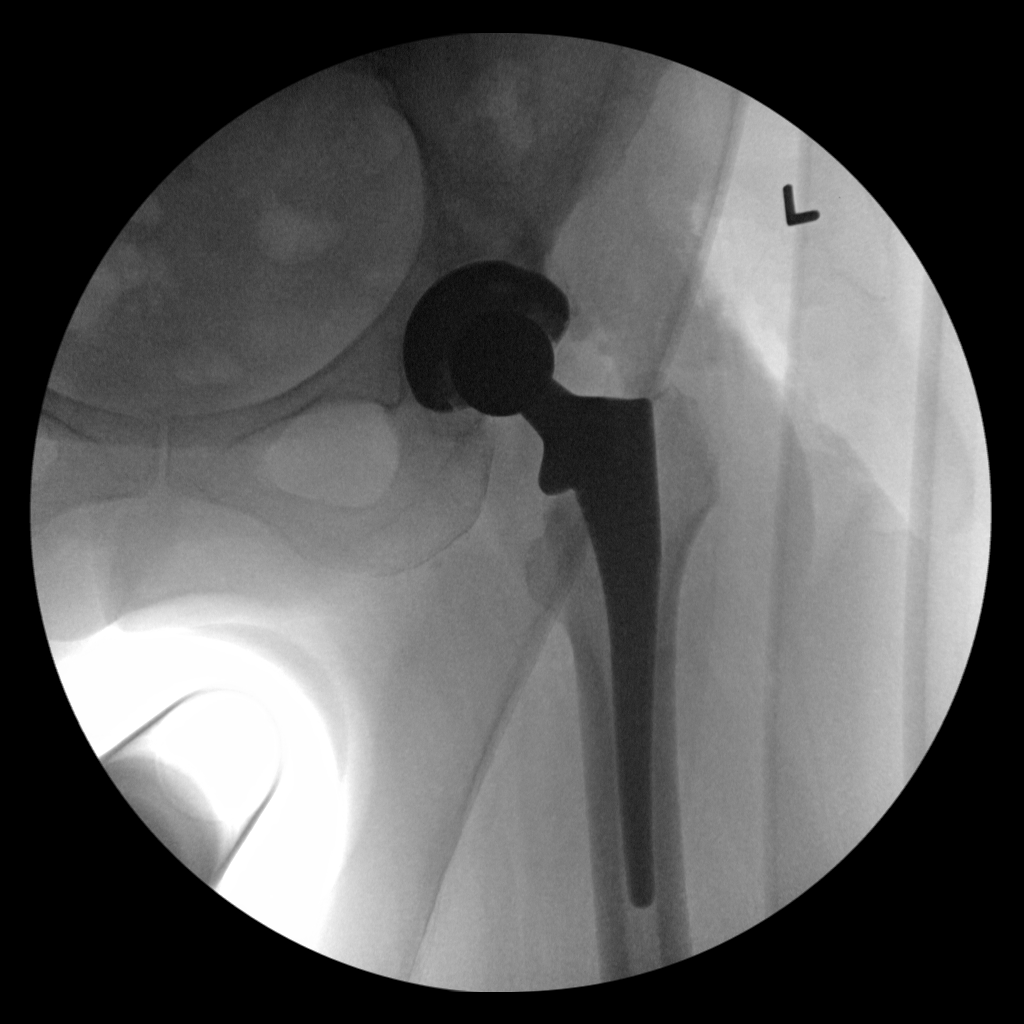
[im 2/2]
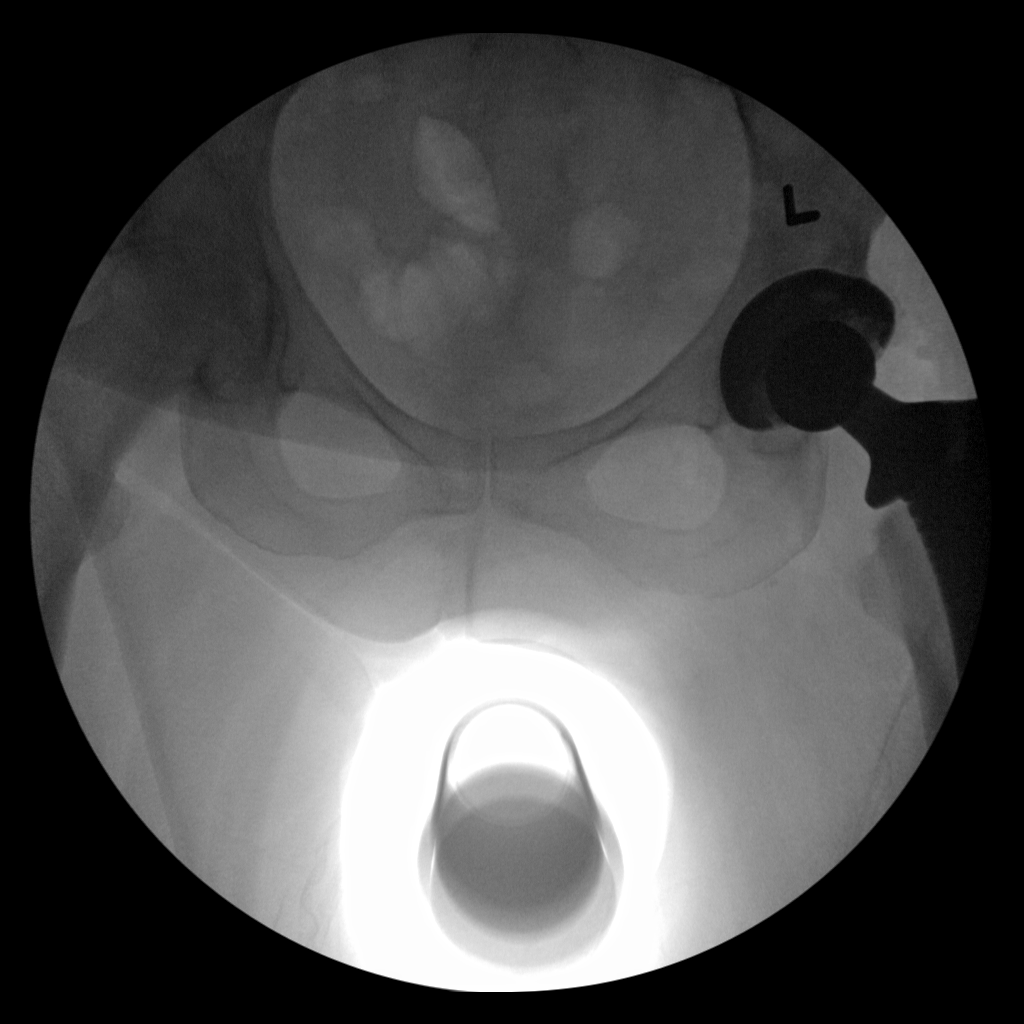

[2 of 2 positions shown; findings below may reference images not displayed]

FINDINGS: The femoral and acetabular components appear to be well situated. No
dislocation is noted.
IMPRESSION: Status post left total hip arthroplasty.

## 2017-01-12 IMAGING — CR DG PORTABLE PELVIS
1 series · 1 of 1 positions shown · non-contrast
Comparison: None.

CLINICAL DATA: Status post left hip replacement.

EXAM:
PORTABLE PELVIS 1-2 VIEWS

[AP]
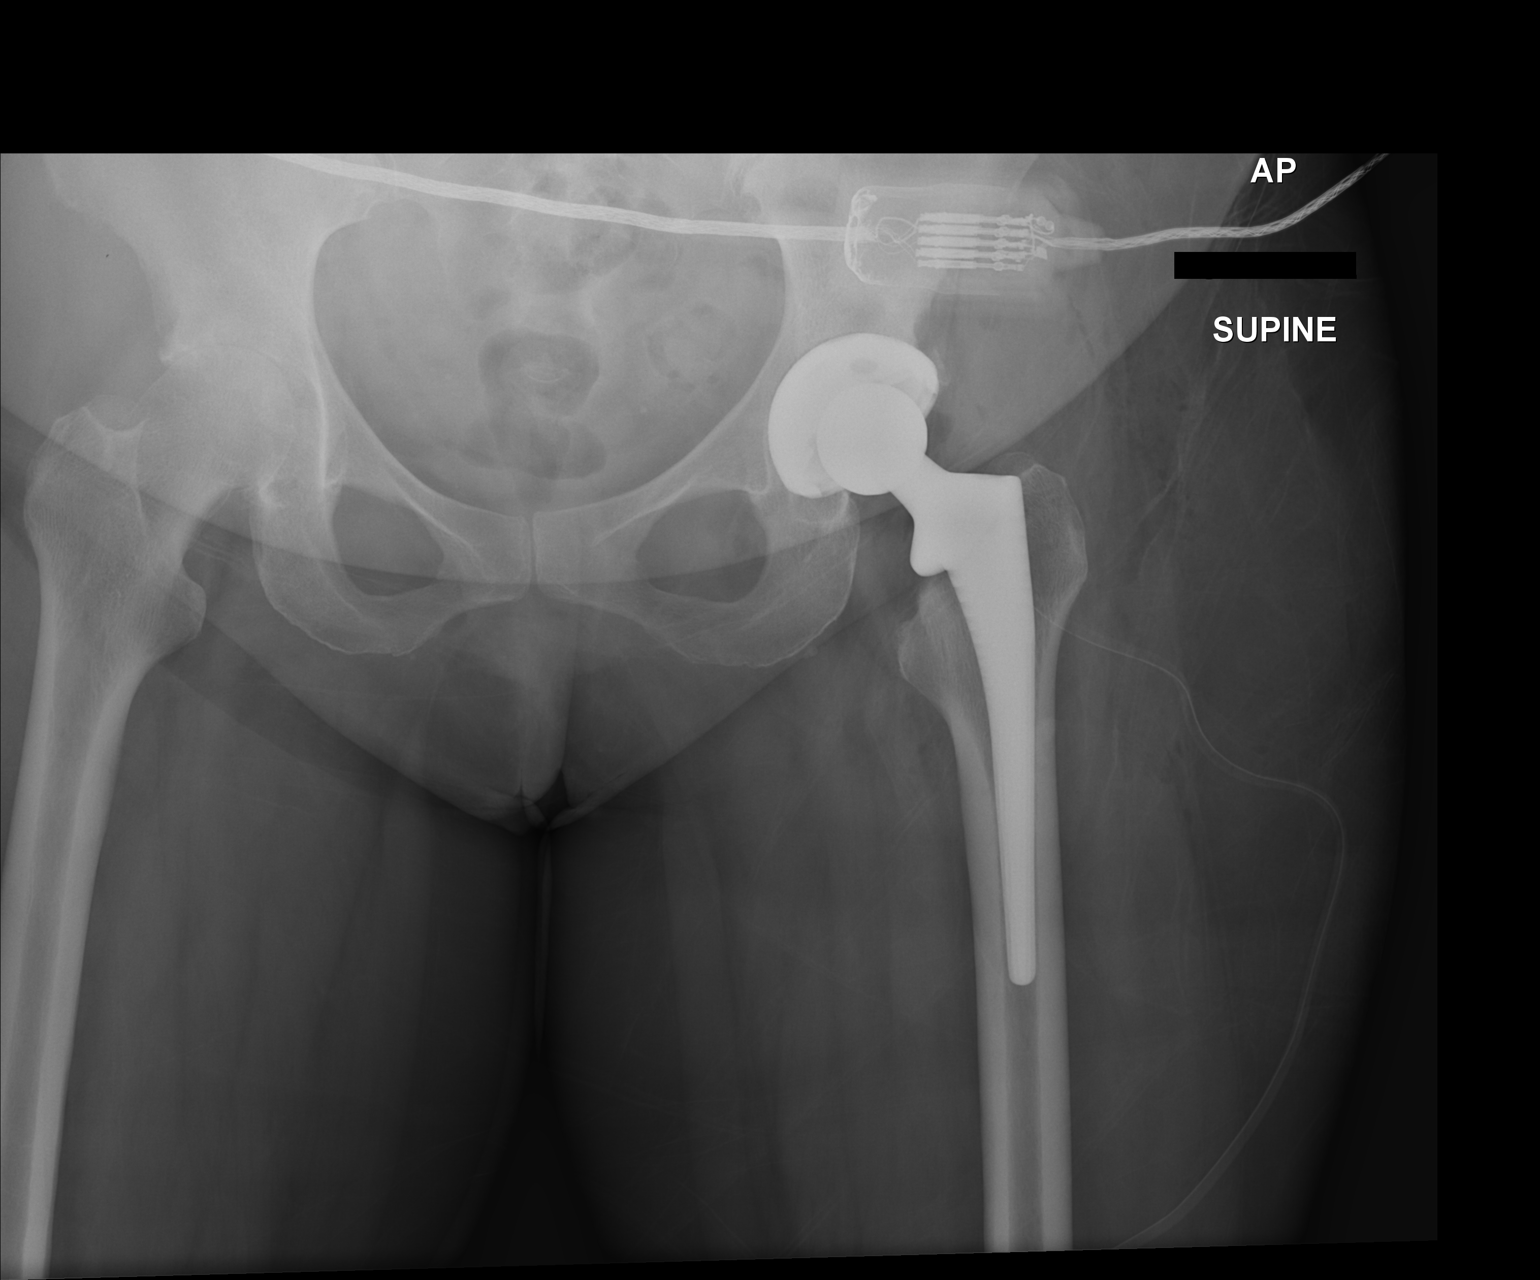

[1 of 1 positions shown; findings below may reference images not displayed]

FINDINGS: The femoral and acetabular components appear to be well situated.
Surgical drain is seen in the adjacent soft tissues. No dislocation
is noted. Severe degenerative joint disease of right hip is noted.
IMPRESSION: Status post left total hip arthroplasty.

## 2017-11-11 ENCOUNTER — Encounter: Payer: Self-pay | Admitting: Registered"

## 2017-11-11 ENCOUNTER — Encounter: Payer: BC Managed Care – PPO | Attending: Family Medicine | Admitting: Registered"

## 2017-11-11 DIAGNOSIS — Z713 Dietary counseling and surveillance: Secondary | ICD-10-CM | POA: Insufficient documentation

## 2017-11-11 DIAGNOSIS — E1165 Type 2 diabetes mellitus with hyperglycemia: Secondary | ICD-10-CM | POA: Diagnosis not present

## 2017-11-11 NOTE — Progress Notes (Signed)
Diabetes Self-Management Education  Visit Type: First/Initial  Appt. Start Time: 1405 Appt. End Time: 1525  11/11/2017  [name], identified by name and date of birth, is a 62 y.o. female with a diagnosis of Diabetes: Type 2.   ASSESSMENT Pt states she would like to know how to eat to avoid adding another DM medication, states her doctor recommended weekly shot.  Pt states she eats 3 meals and 2-3 snacks regularly to help control her hunger and feels it helps to keep blood sugar level. Pt states she does not check her blood sugar often, just when she doesn't feel right or if she ate something she shouldn't have for dinner, will check FBG the next morning, reports she has not seen any pattern of what negatively affects her blood sugar. Pt states she eats lunch at her desk while working, but willing to change and eat more mindfully.  Pt statesshe takes pantoprazole 3x week which controls GERD. Pt states she is able to sleep so does not take [place] very often and will only take 1/2 dose. Pt states her sleep is interrupted by her husband's snoring (he has sleep apnea but won't wear CPAP per patient). Pt states sometimes she sleeps in another room.  Pt reports her stress 5-7/10. Pt states she used to do yoga and enjoyed it, thinking about getting back to it.  Diabetes Self-Management Education - 11/11/17 1418      Visit Information   Visit Type  First/Initial      Initial Visit   Diabetes Type  Type 2    Are you currently following a meal plan?  No    Are you taking your medications as prescribed?  Yes   metformin, jardiance 25 mg   Date Diagnosed  10 yrs ago      Health Coping   How would you rate your overall health?  Good      Psychosocial Assessment   Patient Belief/Attitude about Diabetes  Other (comment)   need to do better   How often do you need to have someone help you when you read instructions, pamphlets, or other written materials from your doctor or pharmacy?  1  - Never    What is the last grade level you completed in school?  graduate studies      Complications   Last HgB A1C per patient/outside source  7.4 %    How often do you check your blood sugar?  --   occassionally checks FBG 140   Fasting Blood glucose range (mg/dL)  161-096    Number of hypoglycemic episodes per month  0    Number of hyperglycemic episodes per week  0    Have you had a dilated eye exam in the past 12 months?  Yes    Have you had a dental exam in the past 12 months?  Yes    Are you checking your feet?  No      Dietary Intake   Breakfast  biscuit, sausage or ham less bread, green tea OR coffee splenda    Snack (morning)  dried apple OR peanuts OR fruit cups OR pickles    Lunch  sandwich OR left overs    Snack (afternoon)  fruit OR chocolate    Dinner  minestroni soup OR pintos, collard, corn bread    Snack (evening)  cheese crackers    Beverage(s)  water, coffee, rarely soda, unsweet lemonade      Exercise   Exercise Type  Light (walking / raking leaves)    How many days per week to you exercise?  2    How many minutes per day do you exercise?  30    Total minutes per week of exercise  60      Patient Education   Previous Diabetes Education  No    Disease state   Definition of diabetes, type 1 and 2, and the diagnosis of diabetes    Nutrition management   Role of diet in the treatment of diabetes and the relationship between the three main macronutrients and blood glucose level    Physical activity and exercise   Role of exercise on diabetes management, blood pressure control and cardiac health.    Medications  Reviewed patients medication for diabetes, action, purpose, timing of dose and side effects.    Monitoring  Purpose and frequency of SMBG.      Individualized Goals (developed by patient)   Nutrition  General guidelines for healthy choices and portions discussed    Physical Activity  Exercise 3-5 times per week    Monitoring   test my blood glucose as  discussed      Outcomes   Expected Outcomes  Demonstrated interest in learning. Expect positive outcomes    Future DMSE  3-4 months    Program Status  Completed      Individualized Plan for Diabetes Self-Management Training:   Learning Objective:  Patient will have a greater understanding of diabetes self-management. Patient education plan is to attend individual and/or group sessions per assessed needs and concerns.  Patient Instructions  If you continue to have diarrhea from metformin, you may want to ask your doctor if the extended release formula could help with those symptoms. Consider taking vitamin B12 or B complex due to long term use of metformin Consider checking your fasting blood sugar daily for 2 weeks and see if you can find patterns. Check 2 hrs after some meals to see how that meal affected your blood sugar. Aim to eat balanced meals and snacks, include protein with carbs, can use snack sheet for ideas Goal: 2 days of weight training, 2 days of walking, go back to Yoga Make getting good quality sleep a priority   Expected Outcomes:  Demonstrated interest in learning. Expect positive outcomes  Education material provided: ADA Diabetes: Your Take Control Guide, A1C conversion sheet and Snack sheet , Sleep Hygiene  If problems or questions, patient to contact team via:  Phone and MyChart  Future DSME appointment: 3-4 months

## 2017-11-11 NOTE — Patient Instructions (Addendum)
If you continue to have diarrhea from metformin, you may want to ask your doctor if the extended release formula could help with those symptoms. Consider taking vitamin B12 or B complex due to long term use of metformin Consider checking your fasting blood sugar daily for 2 weeks and see if you can find patterns. Check 2 hrs after some meals to see how that meal affected your blood sugar. Aim to eat balanced meals and snacks, include protein with carbs, can use snack sheet for ideas Goal: 2 days of weight training, 2 days of walking, go back to Yoga Make getting good quality sleep a priority

## 2019-02-28 ENCOUNTER — Ambulatory Visit: Payer: BC Managed Care – PPO | Attending: Internal Medicine

## 2019-11-23 ENCOUNTER — Other Ambulatory Visit: Payer: Self-pay

## 2019-11-23 ENCOUNTER — Ambulatory Visit: Payer: BC Managed Care – PPO | Admitting: Cardiology

## 2019-11-23 ENCOUNTER — Encounter: Payer: Self-pay | Admitting: Cardiology

## 2019-11-23 VITALS — BP 114/60 | HR 108 | Ht 62.0 in | Wt 161.0 lb

## 2019-11-23 DIAGNOSIS — I10 Essential (primary) hypertension: Secondary | ICD-10-CM

## 2019-11-23 DIAGNOSIS — E119 Type 2 diabetes mellitus without complications: Secondary | ICD-10-CM

## 2019-11-23 DIAGNOSIS — Z8249 Family history of ischemic heart disease and other diseases of the circulatory system: Secondary | ICD-10-CM

## 2019-11-23 DIAGNOSIS — R079 Chest pain, unspecified: Secondary | ICD-10-CM | POA: Diagnosis not present

## 2019-11-23 DIAGNOSIS — R002 Palpitations: Secondary | ICD-10-CM

## 2019-11-23 DIAGNOSIS — E7800 Pure hypercholesterolemia, unspecified: Secondary | ICD-10-CM

## 2019-11-23 DIAGNOSIS — R42 Dizziness and giddiness: Secondary | ICD-10-CM

## 2019-11-23 DIAGNOSIS — Z7189 Other specified counseling: Secondary | ICD-10-CM

## 2019-11-23 NOTE — Progress Notes (Signed)
Cardiology Office Note:    Date:  11/23/2019   ID:  Debra Woods, DOB Jan 17, 1955, MRN [id number]  PCP:  Merri Brunette, MD  Cardiologist:  Jodelle Red, MD  Referring MD: Merri Brunette, MD   Chief Complaint  Patient presents with  . New Patient (Initial Visit)    History of Present Illness:    Debra Woods is a 64 y.o. female with a hx of hypertension, hypercholesterolemia, type II diabetes who is seen as a new consult at the request of Merri Brunette, MD for the evaluation and management of chest pain and palpitations.  Chest pain: -Initial onset: about a month ago. Noted siting down, lying down twice.  -Quality: not pain/squeezing, mild sharp twinges.  -Frequency: happened two times, once sitting and once lying down -Duration: lasts about 1-2 minutes, then resolved without intervention. -Associated symptoms: no nausea, shortness of breath, radiation. Did have palpitations with them, but palpitations can be independent. These are rare (less than weekly) -Aggravating/alleviating factors: just resting made it better. -Prior cardiac history: none -Prior workup: none  -Prior treatment: none -Alcohol: "too much." Drinks about two drinks/night. Likes to drink [place] -Tobacco: former, quit 5-7 years ago -Comorbidities: hypertension, hypercholesterolemia, diabetes. Pending BMET repeat from recent visit as Cr was slightly elevated. -Exercise level: goes to trainer 2x/week, lifts weights. 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 Sister has a "blockage" somewhere.  We discussed options for further evaluation. She would like to avoid radiation and dye if possible. We discussed echo, and she would like to start with this if pain recurs.  She does have episodic lightheadedness with bending over (ie downward dog  with yoga) or sudden position changes. No syncope.  Past Medical History:  Diagnosis Date  . Anxiety    occas  . Arthritis    "hips" (01/28/2015)  . Complication of anesthesia    cpr was done, pt quit breathing 06/04/01 code note states: patient on Morphine PCA and IV toradol post hysterectomy, found apneic and responded to Narcan  . Depression   . Dry eyes   . GERD (gastroesophageal reflux disease)   . Heart murmur    in childhood  . Hyperlipidemia   . Hypertension   . Swelling of both ankles   . Type II diabetes mellitus (HCC)   . Urinary incontinence    ocassionally   . Wears glasses   . Wears partial dentures    upper    Past Surgical History:  Procedure Laterality Date  . ABDOMINAL HYSTERECTOMY  2003   "partial"  . JOINT REPLACEMENT    . TOTAL HIP ARTHROPLASTY Left 01/28/2015   anterior approach  . TOTAL HIP ARTHROPLASTY Left 01/28/2015   Procedure: LEFT TOTAL HIP ARTHROPLASTY ANTERIOR APPROACH;  Surgeon: Ollen Gross, MD;  Location: [facility] OR;  Service: Orthopedics;  Laterality: Left;  . TOTAL HIP ARTHROPLASTY Right 07/20/2015   Procedure: RIGHT TOTAL HIP ARTHROPLASTY ANTERIOR APPROACH;  Surgeon: Ollen Gross, MD;  Location: [facility] ORS;  Service: Orthopedics;  Laterality: Right;  . TUBAL LIGATION      Current Medications: Current Outpatient Medications on File Prior to Visit  Medication Sig  . atorvastatin (LIPITOR) 20 MG tablet Take 20 mg by mouth daily.  Marland Kitchen buPROPion (WELLBUTRIN XL) 300 MG 24 hr tablet Take 300 mg by mouth every morning.  Reported on 01/14/2015  . empagliflozin (JARDIANCE) 25 MG TABS tablet Take 25 mg by mouth daily.  . furosemide (LASIX) 20 MG tablet Take 20 mg by mouth daily as needed for fluid.  Marland Kitchen. lisinopril-hydrochlorothiazide (PRINZIDE,ZESTORETIC) 10-12.5 MG tablet Take 1 tablet by mouth daily.  . metFORMIN (GLUCOPHAGE) 850 MG tablet Take 850 mg by mouth 2 (two) times daily.  . methocarbamol (ROBAXIN) 500 MG tablet Take 1 tablet (500 mg total) by mouth  every 6 (six) hours as needed for muscle spasms.  Marland Kitchen. oxyCODONE (OXY IR/ROXICODONE) 5 MG immediate release tablet Take 1-2 tablets (5-10 mg total) by mouth every 3 (three) hours as needed for moderate pain or severe pain.  . pantoprazole (PROTONIX) 40 MG tablet Take 40 mg by mouth daily.  Marland Kitchen. zolpidem (AMBIEN) 10 MG tablet Take 5 mg by mouth at bedtime as needed for sleep.    No current facility-administered medications on file prior to visit.     Allergies:   Other   Social History   Tobacco Use  . Smoking status: Former Smoker    Packs/day: 0.25    Years: 10.00    Pack years: 2.50    Types: Cigarettes  . Smokeless tobacco: Never Used  Substance Use Topics  . Alcohol use: Yes    Alcohol/week: 2.0 standard drinks    Types: 2 Glasses of wine per week    Comment: wine 3 times per week 2 cups   . Drug use: No    Family History: mat Gma had CVA, father had MI. Sister has a "blockage" somewhere.  ROS:   Please see the history of present illness.  Additional pertinent ROS: Constitutional: Negative for chills, fever, night sweats, unintentional weight loss  HENT: Negative for ear pain and hearing loss.   Eyes: Negative for loss of vision and eye pain.  Respiratory: Negative for cough, sputum, wheezing.   Cardiovascular: See HPI. Gastrointestinal: Negative for abdominal pain, melena, and hematochezia.  Genitourinary: Negative for dysuria and hematuria.  Musculoskeletal: Negative for falls and myalgias.  Skin: Negative for itching and rash.  Neurological: Negative for focal weakness, focal sensory changes and loss of consciousness.  Endo/Heme/Allergies: Does not bruise/bleed easily.     EKGs/Labs/Other Studies Reviewed:    The following studies were reviewed today: No prior cardiac studies  EKG:  EKG is personally reviewed.  The ekg ordered today demonstrates sinus tachycardia at 108 bpm  Recent Labs: No results found for requested labs within last 8760 hours.  Recent Lipid  Panel No results found for: CHOL, TRIG, HDL, CHOLHDL, VLDL, LDLCALC, LDLDIRECT  Physical Exam:    VS:  BP 114/60 (BP Location: Left Arm, Patient Position: Sitting, Cuff Size: Normal)   Pulse (!) 108   Ht 5\' 2"  (1.575 m)   Wt 161 lb (73 kg)   BMI 29.45 kg/m     Wt Readings from Last 3 Encounters:  11/23/19 161 lb (73 kg)  07/20/15 161 lb (73 kg)  07/13/15 161 lb 9 oz (73.3 kg)    GEN: Well nourished, well developed in no acute distress HEENT: Normal, moist mucous membranes NECK: No JVD CARDIAC: regular rhythm, normal S1 and S2, no rubs or gallops. No murmurs. VASCULAR: Radial and DP pulses 2+ bilaterally. No carotid bruits RESPIRATORY:  Clear to auscultation without rales, wheezing or rhonchi  ABDOMEN: Soft, non-tender, non-distended MUSCULOSKELETAL:  Ambulates independently SKIN: Warm and dry, no edema NEUROLOGIC:  Alert and oriented x 3. No focal neuro deficits noted. PSYCHIATRIC:  Normal affect  ASSESSMENT:    1. Chest pain of uncertain etiology   2. Heart palpitations   3. Episodic lightheadedness   4. Family history of heart disease   5. Cardiac risk counseling   6. Counseling on health promotion and disease prevention   7. Essential hypertension   8. Pure hypercholesterolemia   9. Type 2 diabetes mellitus without complication, without long-term current use of insulin (HCC)    PLAN:    Chest pain Palpitations Episodic lightheadedness -we discussed these symptoms at length today We spent significant time today reviewing different parts of the cardiovascular system (electrical, vascular, functional, and valvular). We discussed how each of these systems can present with different symptoms. We reviewed that there are different ways we evaluate these symptoms with tests.  -we discussed options for further evaluations. At this time, she declines additional testing and would like to continue monitoring her symptoms. She will contact me if symptoms worsen. -discussed  red flag warning signs that need immediate medical attention  Hypertension: at goal today -continue lisinopril-HCTZ  Hypercholesterolemia: -continue atorvastatin  Type II diabetes, not on insulin: -last A1c 7.5 per [facility] -on empagliflozin, metformin  Cardiac risk counseling and prevention recommendations: with family history of heart disease -recommend heart healthy/Mediterranean diet, with whole grains, fruits, vegetable, fish, lean meats, nuts, and olive oil. Limit salt. -recommend moderate walking, 3-5 times/week for 30-50 minutes each session. Aim for at least 150 minutes.week. Goal should be pace of 3 miles/hours, or walking 1.5 miles in 30 minutes -recommend avoidance of tobacco products. Avoid excess alcohol. -ASCVD risk score: The ASCVD Risk score Denman George DC Jr., et al., 2013) failed to calculate for the following reasons:   Cannot find a previous HDL lab   Cannot find a previous total cholesterol lab    Plan for follow up: 1 year or sooner as needed  Jodelle Red, MD, PhD [place]  [facility]    Medication Adjustments/Labs and Tests Ordered: Current medicines are reviewed at length with the patient today.  Concerns regarding medicines are outlined above.  Orders Placed This Encounter  Procedures  . EKG 12-Lead   No orders of the defined types were placed in this encounter.   Patient Instructions  Medication Instructions:  Your Physician recommend you continue on your current medication as directed.    *If you need a refill on your cardiac medications before your next appointment, please call your pharmacy*   Lab Work: None  Testing/Procedures: None   Follow-Up: At [facility], you and your health needs are our priority.  As part of our continuing mission to provide you with exceptional heart care, we have created designated Provider Care Teams.  These Care Teams include your primary Cardiologist (physician) and Advanced Practice Providers  (APPs -  Physician Assistants and Nurse Practitioners) who all work together to provide you with the care you need, when you need it.  We recommend signing up for the patient portal called "MyChart".  Sign up information is provided on this After Visit Summary.  MyChart is used to connect with patients for Virtual Visits (Telemedicine).  Patients are able to view lab/test results, encounter notes, upcoming appointments, etc.  Non-urgent messages can be sent to your provider as well.   To learn more about what you can do with MyChart, go to [email or web address].    Your next appointment:   1 year(s)  The format for your next appointment:   In Person  Provider:   Jodelle Red, MD  Signed, Jodelle Red, MD PhD 11/23/2019     [facility]

## 2019-11-23 NOTE — Patient Instructions (Signed)

## 2020-03-13 ENCOUNTER — Encounter: Payer: Self-pay | Admitting: Cardiology

## 2021-02-09 ENCOUNTER — Telehealth: Payer: Self-pay | Admitting: Cardiology

## 2021-02-09 NOTE — Telephone Encounter (Signed)
Called patient to schedule recall. She states she is not interested in following up with the office.

## 2022-12-13 DIAGNOSIS — Z124 Encounter for screening for malignant neoplasm of cervix: Secondary | ICD-10-CM | POA: Diagnosis not present

## 2022-12-13 DIAGNOSIS — Z6829 Body mass index (BMI) 29.0-29.9, adult: Secondary | ICD-10-CM | POA: Diagnosis not present

## 2022-12-13 DIAGNOSIS — Z1272 Encounter for screening for malignant neoplasm of vagina: Secondary | ICD-10-CM | POA: Diagnosis not present

## 2022-12-13 DIAGNOSIS — N959 Unspecified menopausal and perimenopausal disorder: Secondary | ICD-10-CM | POA: Diagnosis not present

## 2022-12-14 ENCOUNTER — Other Ambulatory Visit (HOSPITAL_BASED_OUTPATIENT_CLINIC_OR_DEPARTMENT_OTHER): Payer: Self-pay | Admitting: Obstetrics and Gynecology

## 2022-12-14 DIAGNOSIS — Z8249 Family history of ischemic heart disease and other diseases of the circulatory system: Secondary | ICD-10-CM

## 2022-12-31 DIAGNOSIS — Z1231 Encounter for screening mammogram for malignant neoplasm of breast: Secondary | ICD-10-CM | POA: Diagnosis not present

## 2023-01-10 DIAGNOSIS — E1165 Type 2 diabetes mellitus with hyperglycemia: Secondary | ICD-10-CM | POA: Diagnosis not present

## 2023-01-10 DIAGNOSIS — G4700 Insomnia, unspecified: Secondary | ICD-10-CM | POA: Diagnosis not present

## 2023-01-10 DIAGNOSIS — D473 Essential (hemorrhagic) thrombocythemia: Secondary | ICD-10-CM | POA: Diagnosis not present

## 2023-01-10 DIAGNOSIS — I10 Essential (primary) hypertension: Secondary | ICD-10-CM | POA: Diagnosis not present

## 2023-01-10 DIAGNOSIS — Z1331 Encounter for screening for depression: Secondary | ICD-10-CM | POA: Diagnosis not present

## 2023-01-10 DIAGNOSIS — E782 Mixed hyperlipidemia: Secondary | ICD-10-CM | POA: Diagnosis not present

## 2023-01-10 DIAGNOSIS — Z0000 Encounter for general adult medical examination without abnormal findings: Secondary | ICD-10-CM | POA: Diagnosis not present

## 2023-01-10 DIAGNOSIS — N1831 Chronic kidney disease, stage 3a: Secondary | ICD-10-CM | POA: Diagnosis not present

## 2023-01-10 DIAGNOSIS — H35033 Hypertensive retinopathy, bilateral: Secondary | ICD-10-CM | POA: Diagnosis not present

## 2023-01-10 DIAGNOSIS — Z23 Encounter for immunization: Secondary | ICD-10-CM | POA: Diagnosis not present

## 2023-02-12 ENCOUNTER — Ambulatory Visit (HOSPITAL_BASED_OUTPATIENT_CLINIC_OR_DEPARTMENT_OTHER)
Admission: RE | Admit: 2023-02-12 | Discharge: 2023-02-12 | Disposition: A | Discharge status: Home / Self Care | Payer: BC Managed Care – PPO | Source: Ambulatory Visit | Attending: Obstetrics and Gynecology | Admitting: Obstetrics and Gynecology

## 2023-02-12 DIAGNOSIS — Z8249 Family history of ischemic heart disease and other diseases of the circulatory system: Secondary | ICD-10-CM | POA: Insufficient documentation

## 2023-03-15 DIAGNOSIS — J029 Acute pharyngitis, unspecified: Secondary | ICD-10-CM | POA: Diagnosis not present

## 2023-03-19 DIAGNOSIS — J019 Acute sinusitis, unspecified: Secondary | ICD-10-CM | POA: Diagnosis not present

## 2023-03-22 DIAGNOSIS — Z13820 Encounter for screening for osteoporosis: Secondary | ICD-10-CM | POA: Diagnosis not present

## 2023-03-22 DIAGNOSIS — N958 Other specified menopausal and perimenopausal disorders: Secondary | ICD-10-CM | POA: Diagnosis not present

## 2023-04-19 DIAGNOSIS — S46819A Strain of other muscles, fascia and tendons at shoulder and upper arm level, unspecified arm, initial encounter: Secondary | ICD-10-CM | POA: Diagnosis not present

## 2023-04-21 NOTE — Progress Notes (Deleted)
  Cardiology Office Note:  .   Date:  04/21/2023  ID:  Debra Woods, DOB 19-Sep-1955, MRN [id number] PCP: Faustina Hood, MD  [facility] Providers Cardiologist:  Sheryle Donning, MD { Click to update primary MD,subspecialty MD or APP then REFRESH:1}   History of Present Illness: .   No chief complaint on file.   Debra Woods is a 68 y.o. female with history of DM, HTN, HLD who presents for the evaluation of CAC at the request of Merryl Abraham, MD.     Problem List CAC  -CAC 12 (67th percentile) 2. HLD 3. DM 4. HTN    ROS: All other ROS reviewed and negative. Pertinent positives noted in the HPI.     Studies Reviewed: Aaron Aas       Physical Exam:   VS:  There were no vitals taken for this visit.   Wt Readings from Last 3 Encounters:  11/23/19 161 lb (73 kg)  07/20/15 161 lb (73 kg)  07/13/15 161 lb 9 oz (73.3 kg)    GEN: Well nourished, well developed in no acute distress NECK: No JVD; No carotid bruits CARDIAC: ***RRR, no murmurs, rubs, gallops RESPIRATORY:  Clear to auscultation without rales, wheezing or rhonchi  ABDOMEN: Soft, non-tender, non-distended EXTREMITIES:  No edema; No deformity  ASSESSMENT AND PLAN: .   ***    {Are you ordering a CV Procedure (e.g. stress test, cath, DCCV, TEE, etc)?   Press F2        :[id number]}   Follow-up: No follow-ups on file.  Time Spent with Patient: I have spent a total of *** minutes caring for this patient today face to face, ordering and reviewing labs/tests, reviewing prior records/medical history, examining the patient, establishing an assessment and plan, communicating results/findings to the patient/family, and documenting in the medical record.   Signed, Gigi Kyle. Rolm Clos, MD, [facility]  [facility]  [address] [place], [place] [address] [phone]  2:41 PM

## 2023-04-23 ENCOUNTER — Ambulatory Visit (HOSPITAL_BASED_OUTPATIENT_CLINIC_OR_DEPARTMENT_OTHER): Payer: Medicare PPO | Admitting: Cardiovascular Disease

## 2023-04-23 DIAGNOSIS — I10 Essential (primary) hypertension: Secondary | ICD-10-CM

## 2023-04-23 DIAGNOSIS — E782 Mixed hyperlipidemia: Secondary | ICD-10-CM

## 2023-04-23 DIAGNOSIS — R931 Abnormal findings on diagnostic imaging of heart and coronary circulation: Secondary | ICD-10-CM

## 2023-05-03 DIAGNOSIS — D2371 Other benign neoplasm of skin of right lower limb, including hip: Secondary | ICD-10-CM | POA: Diagnosis not present

## 2023-05-15 DIAGNOSIS — H524 Presbyopia: Secondary | ICD-10-CM | POA: Diagnosis not present

## 2023-05-16 ENCOUNTER — Other Ambulatory Visit: Payer: Self-pay

## 2023-05-16 DIAGNOSIS — Z1231 Encounter for screening mammogram for malignant neoplasm of breast: Secondary | ICD-10-CM

## 2023-05-16 DIAGNOSIS — N6011 Diffuse cystic mastopathy of right breast: Secondary | ICD-10-CM | POA: Diagnosis not present

## 2023-05-20 ENCOUNTER — Other Ambulatory Visit: Payer: Self-pay | Admitting: Family Medicine

## 2023-05-20 ENCOUNTER — Encounter: Payer: Self-pay | Admitting: Family Medicine

## 2023-05-20 DIAGNOSIS — N6011 Diffuse cystic mastopathy of right breast: Secondary | ICD-10-CM

## 2023-05-24 DIAGNOSIS — N6315 Unspecified lump in the right breast, overlapping quadrants: Secondary | ICD-10-CM | POA: Diagnosis not present

## 2023-05-24 DIAGNOSIS — N6011 Diffuse cystic mastopathy of right breast: Secondary | ICD-10-CM | POA: Diagnosis not present

## 2023-07-11 DIAGNOSIS — H35033 Hypertensive retinopathy, bilateral: Secondary | ICD-10-CM | POA: Diagnosis not present

## 2023-07-11 DIAGNOSIS — E669 Obesity, unspecified: Secondary | ICD-10-CM | POA: Diagnosis not present

## 2023-07-11 DIAGNOSIS — N1831 Chronic kidney disease, stage 3a: Secondary | ICD-10-CM | POA: Diagnosis not present

## 2023-07-11 DIAGNOSIS — E782 Mixed hyperlipidemia: Secondary | ICD-10-CM | POA: Diagnosis not present

## 2023-07-11 DIAGNOSIS — E1165 Type 2 diabetes mellitus with hyperglycemia: Secondary | ICD-10-CM | POA: Diagnosis not present

## 2023-07-11 DIAGNOSIS — F325 Major depressive disorder, single episode, in full remission: Secondary | ICD-10-CM | POA: Diagnosis not present

## 2023-07-11 DIAGNOSIS — G4700 Insomnia, unspecified: Secondary | ICD-10-CM | POA: Diagnosis not present

## 2023-07-11 DIAGNOSIS — I10 Essential (primary) hypertension: Secondary | ICD-10-CM | POA: Diagnosis not present

## 2023-09-17 DIAGNOSIS — J019 Acute sinusitis, unspecified: Secondary | ICD-10-CM | POA: Diagnosis not present

## 2023-09-17 DIAGNOSIS — R059 Cough, unspecified: Secondary | ICD-10-CM | POA: Diagnosis not present

## 2023-10-10 DIAGNOSIS — T7840XA Allergy, unspecified, initial encounter: Secondary | ICD-10-CM | POA: Diagnosis not present

## 2023-10-10 DIAGNOSIS — R21 Rash and other nonspecific skin eruption: Secondary | ICD-10-CM | POA: Diagnosis not present
